# Patient Record
Sex: Female | Born: 1985 | Race: White | Hispanic: No | Marital: Married | Smoking: Former smoker
Health system: Southern US, Community
[De-identification: ages and names within clinical notes are randomized; demographics above are authoritative.]

## PROBLEM LIST (undated history)

## (undated) DIAGNOSIS — O24419 Gestational diabetes mellitus in pregnancy, unspecified control: Secondary | ICD-10-CM

## (undated) DIAGNOSIS — N83209 Unspecified ovarian cyst, unspecified side: Secondary | ICD-10-CM

## (undated) DIAGNOSIS — F419 Anxiety disorder, unspecified: Secondary | ICD-10-CM

## (undated) DIAGNOSIS — D649 Anemia, unspecified: Secondary | ICD-10-CM

## (undated) DIAGNOSIS — Z8719 Personal history of other diseases of the digestive system: Secondary | ICD-10-CM

## (undated) HISTORY — DX: Gestational diabetes mellitus in pregnancy, unspecified control: O24.419

---

## 2005-07-16 ENCOUNTER — Ambulatory Visit: Payer: Self-pay

## 2005-07-22 ENCOUNTER — Inpatient Hospital Stay: Payer: Self-pay | Admitting: Obstetrics and Gynecology

## 2015-08-24 ENCOUNTER — Other Ambulatory Visit: Payer: Self-pay | Admitting: Obstetrics

## 2015-10-31 ENCOUNTER — Other Ambulatory Visit (HOSPITAL_COMMUNITY): Payer: Self-pay | Admitting: General Surgery

## 2015-10-31 ENCOUNTER — Other Ambulatory Visit: Payer: Self-pay | Admitting: General Surgery

## 2015-11-01 ENCOUNTER — Ambulatory Visit
Admission: RE | Admit: 2015-11-01 | Discharge: 2015-11-01 | Disposition: A | Discharge status: Home / Self Care | Payer: Managed Care, Other (non HMO) | Source: Ambulatory Visit | Attending: General Surgery | Admitting: General Surgery

## 2015-11-01 DIAGNOSIS — I498 Other specified cardiac arrhythmias: Secondary | ICD-10-CM | POA: Insufficient documentation

## 2015-12-05 ENCOUNTER — Encounter: Payer: Self-pay | Admitting: Dietician

## 2015-12-05 ENCOUNTER — Encounter: Payer: Managed Care, Other (non HMO) | Attending: General Surgery | Admitting: Dietician

## 2015-12-05 DIAGNOSIS — Z713 Dietary counseling and surveillance: Secondary | ICD-10-CM | POA: Diagnosis not present

## 2015-12-05 NOTE — Progress Notes (Signed)
Medical Nutrition Therapy: Visit start time: 0900  end time: 1000  Assessment:  Diagnosis: obesity Past medical history: none significant Psychosocial issues/ stress concerns: none Preferred learning method:  . Visual  Current weight: 288.5lbs  Height: 5'6" Medications, supplements: Sprintec  Progress and evaluation: Patient reports weight gain of 88lbs in the past year since her wedding.  Was at lowest weight about 3 years ago. Had job change to more sedentary job with fewer hours and has since regained weight.  Highest weight was after c-section 10 years ago, about 315lbs. Has tried multiple diets in the past 10 years, including use of phentermine, with limited and only short term success.   Physical activity: no structured activity; household chores, usually standing at work. Plans to increase activity. Likes to exercise, used to play sports in high school.  Dietary Intake:  Usual eating pattern includes 3 meals and 1 snacks per day. Dining out frequency: 2-3 meals per week.  Breakfast: cereal, bagel, nutrigrain bars, or low-cal breakfast sandwich Snack: occasional granola bar Lunch: sandwich, sometimes salad, fruit or yogurt Snack: usually none Supper: varies; 11/5 spaghetti, usually a meat and veggies including some starchy vegetables.  Snack: grapes or mini candy or ice cream Beverages: water often with sugar free flavoring, maybe 1 diet soda daily  Nutrition Care Education: Topics covered: bariatric diet and pre-op nutrition Basic nutrition:  appropriate nutrient balance, appropriate meal and snack schedule Bariatric nutrition: importance of making changes prior to surgery to avoid difficult transition and side effects post-op; options for protein drinks and use for liver shrinking diet and post-op nutrition; diet stages after surgery, including goals for fluid and protein intake; importance of emotional preparedness for permanent and constant diet changes; importance of emotional  support in surgery and weight loss process.    Nutritional Diagnosis:  [place]-3.3 Overweight/obesity As related to history of excess calories and frequent dieting.  As evidenced by patient report.  Intervention: Instruction as noted above.   Set goals with patient input.    Patient has been researching weight loss surgery for some time; she is familiar with and prepared for diet and lifestyle changes, has family support. From a nutrition standpoint she is ready to proceed with the bariatric surgery program.    Patient has been instructed to schedule pre-op nutrition class 3-4 weeks prior to surgery, and to schedule RD follow-up for 2-3 weeks after surgery.   Education Materials given:  Marland Kitchen. Bariatric surgery guidelines and diet: pre-op goals, diet stages after surgery . Food lists/ Planning A Balanced Meal . Goals/ instructions  Learner/ who was taught:  . Patient   Level of understanding: Marland Kitchen. Verbalizes/ demonstrates competency . Not applicable Demonstrated degree of understanding via:   Teach back Learning barriers: . None  Willingness to learn/ readiness for change: . Eager, change in progress  Monitoring and Evaluation:  Dietary intake, exercise, and body weight      follow up: to be determined

## 2015-12-05 NOTE — Patient Instructions (Signed)
   Continue to eat your meals and 1-2 small snacks daily.   Start taste testing some protein shakes or powders to see which work for you.   Limit portions of starchy foods, eat lean protein foods and plenty of low-carb veggies.   Limit fluid intake during meals.   Begin some light exercise for 15 minutes at a time as you are able.   Remember to schedule pre-op nutrition class, and post op visit with Pam when you get your surgery date.

## 2016-02-12 ENCOUNTER — Encounter: Payer: Self-pay | Admitting: Dietician

## 2016-02-12 ENCOUNTER — Encounter: Payer: Managed Care, Other (non HMO) | Attending: General Surgery | Admitting: Dietician

## 2016-02-12 DIAGNOSIS — Z713 Dietary counseling and surveillance: Secondary | ICD-10-CM | POA: Diagnosis present

## 2016-02-12 NOTE — Progress Notes (Signed)
  Pre-Operative Nutrition Class:  Appt start time: 830   End time:  1000  Patient was seen on 02/12/16 for Pre-Operative Bariatric Surgery Education at [facility].   Surgery date:  Surgery type: LAGB Start weight at [facility]: 288.5 lbs on 12/05/2015 Weight today: 295.4 lbs  TANITA  BODY COMP RESULTS  02/12/16   BMI (kg/m^2) 47.7   Fat Mass (lbs) 163.4   Fat Free Mass (lbs) 132   Total Body Water (lbs) 99.4   Samples given per MNT protocol. Patient educated on appropriate usage: Bariatric Advantage Multivitamin (mixed fruit - qty 1) Lot #: [id number] Exp: 11/2016  Bariatric Advantage Calcium Citrate chew (lemon - qty 1) Lot #: [id number] Exp: 06/2016  Premier protein shake (vanilla - qty 1) Lot #: [id number] Exp: 10/2016  The following the learning objectives were met by the patient during this course:  Identify Pre-Op Dietary Goals and will begin 2 weeks pre-operatively  Identify appropriate sources of fluids and proteins   State protein recommendations and appropriate sources pre and post-operatively  Identify Post-Operative Dietary Goals and will follow for 2 weeks post-operatively  Identify appropriate multivitamin and calcium sources  Describe the need for physical activity post-operatively and will follow MD recommendations  State when to call healthcare provider regarding medication questions or post-operative complications  Handouts given during class include:  Pre-Op Bariatric Surgery Diet Handout  Protein Shake Handout  Post-Op Bariatric Surgery Nutrition Handout  BELT Program Information Flyer  Support Group Information Flyer  [facility] Bariatric Supplements Price List  Follow-Up Plan: Patient will follow-up at [facility] 2 weeks post operatively for diet advancement per MD.

## 2016-03-22 ENCOUNTER — Encounter: Payer: Managed Care, Other (non HMO) | Attending: General Surgery | Admitting: Dietician

## 2016-03-22 ENCOUNTER — Encounter: Payer: Self-pay | Admitting: Dietician

## 2016-03-22 DIAGNOSIS — Z713 Dietary counseling and surveillance: Secondary | ICD-10-CM | POA: Insufficient documentation

## 2016-03-22 NOTE — Patient Instructions (Signed)
   Continue with your current eating pattern.  Keep up your regular walking and exercise, great job!

## 2016-03-22 NOTE — Progress Notes (Signed)
Medical Nutrition Therapy: Visit start time: 1345  end time: 1415  Assessment:  Diagnosis: obesity Medical history changes: no changes since pervious visit, 12/05/15 Psychosocial issues/ stress concerns: none  Current weight: 303.4lbs  Height: 5'6" Medications, supplement changes: reconciled list in medical record  Progress and evaluation: Patient reports a period of frustration after finding out her surgery approval would be delayed, and neglected diet for a short while. She now reports restricting portions of sweets, starches, including lean protein foods, and she is avoiding fried foods, candy, other high-calorie choices. She has increased physical activity. She also has tasted some protein shakes, and sometimes has a shake for her breakfast meal. She is taking a multivitamin daily.  Physical activity: walking 20 minutes, 4 times a week or more, playing with puppy outside at home 10-15 minutes daily. Also increased walking during the day at work.   Dietary Intake:  Usual eating pattern includes 3 meals and 1-2 snacks per day. Dining out frequency: 4 meals per week.  Breakfast: protein shake, or low sugar ceral Snack: trail mix or crackers Lunch: salad or sandwich, fruit or yogurt or cheese stick Snack: usually none Supper: lean meat, veggies Snack: fruit or crackers or trail mix. Beverages: water, powerade zero  Nutrition Care Education: Topics covered: weight management, bariatric diet pre-op goals Weight control: reviewed progress since previous visit. Discussed current eating pattern and physical activity.  Bariatric diet:  Reviewed importance of eating at regular intervals, controlling carbohydrate intake and preparing for very low carb diet just prior to and after surgery. Reviewed importance of positive emotional support from others during the weight loss journey.   Nutritional Diagnosis:  [place]-3.3 Overweight/obesity As related to history of dieting, excess calories, inactivity.   As evidenced by patient report.  Intervention: Discussion as noted above.   Commended patient for resuming healthy eating pattern and starting exercise.    She will continue with these habits until next visit on 04/15/16.    Learner/ who was taught:  . Patient   Level of understanding: Marland Kitchen. Verbalizes/ demonstrates competency  Demonstrated degree of understanding via:   Teach back Learning barriers: . None  Willingness to learn/ readiness for change: . Eager, change in progress  Monitoring and Evaluation:  Dietary intake, exercise, and body weight      follow up: 04/15/16

## 2016-04-15 ENCOUNTER — Encounter: Payer: Self-pay | Admitting: Dietician

## 2016-04-15 ENCOUNTER — Encounter: Payer: Managed Care, Other (non HMO) | Attending: General Surgery | Admitting: Dietician

## 2016-04-15 DIAGNOSIS — Z713 Dietary counseling and surveillance: Secondary | ICD-10-CM | POA: Insufficient documentation

## 2016-04-15 NOTE — Progress Notes (Signed)
Medical Nutrition Therapy: Visit start time: 1320  end time: 1335  Assessment:  Diagnosis: obesity Medical history changes: no changes per patient Psychosocial issues/ stress concerns: none  Current weight: 305lbs  Height: 5'6" Medications, supplement changes: no changes per patient  Progress and evaluation: Weight is within 2lbs of visit on 03/22/26. Eating pattern remains consistent. Patient continues to sample protein drinks and use them for meals on occasion.  She reports increased stress at work recently, so has not been able to eat snacks on a regular basis. She continues to drink sugar free beverages, avoids sodas, and is generally making healthy food choices.    Physical activity: walking 20 minutes, 4 times a week + playing with puppy  Dietary Intake:  Usual eating pattern includes 3 meals and 0-1 snacks per day. Dining out frequency: 4 meals per week.  Breakfast: low sugar cereal, occasional protein shake Snack: none or trail mix or crackers Lunch: salad or sandwich with fruit or yogurt or cheese stick Snack: none  Supper: lean meat, vegetables Snack: fruit or trail mix Beverages: water, powerade zero  Nutrition Care Education: Topics covered: weight management Weight control: reviewed progress since previous visit. Discussed importance of allowing time before surgery to prepare for lifestyle changes.    Nutritional Diagnosis:  [place]-3.3 Overweight/obesity As related to history of excess calories, inactivity.  As evidenced by patient report, BMI 49.  Intervention: Discussion as noted above.   Patient voices determination to adhere to bariatric diet after surgery, she is hoping to become pregnant when safe after surgery.    She will continue with regular physical activity and healthy eating pattern, no new goals at this time.        Education Materials given:  Marland Kitchen. Goals/ instructions  Learner/ who was taught:  . Patient   Level of understanding: Marland Kitchen. Verbalizes/  demonstrates competency  Demonstrated degree of understanding via:   Teach back Learning barriers: . None  Willingness to learn/ readiness for change: . Eager, change in progress  Monitoring and Evaluation:  Dietary intake, exercise, and body weight      follow up: 05/13/16

## 2016-04-15 NOTE — Patient Instructions (Signed)
Continue with current eating pattern and regular physical exercise.

## 2016-05-13 ENCOUNTER — Encounter: Payer: Self-pay | Admitting: Dietician

## 2016-05-13 ENCOUNTER — Encounter: Payer: Managed Care, Other (non HMO) | Attending: General Surgery | Admitting: Dietician

## 2016-05-13 DIAGNOSIS — Z713 Dietary counseling and surveillance: Secondary | ICD-10-CM | POA: Insufficient documentation

## 2016-05-13 NOTE — Patient Instructions (Addendum)
   Implement pre-op liver reduction diet fully this week.   Continue to reduce fluid intake during meals, take only small sips if needed to moisten food.   Call with any questions or concerns.

## 2016-05-13 NOTE — Progress Notes (Signed)
Medical Nutrition Therapy: Visit start time: 1330  end time: 1400  Assessment:  Diagnosis: obesity, bariatric surgery prep Medical history changes: no changes per patient Psychosocial issues/ stress concerns: none  Current weight: 305.4lbs  Height: 5'6" Medications, supplement changes: no changes   Progress and evaluation: Patient continues to make changes in preparation for weight loss surgery. She has purchased appropriate foods and protein drinks to begin liver reduction diet phase tomorrow. She has sought out opportunities for physical activity.  She receives good support from family members and coworkers.   Physical activity: walking, playing with puppy, squats, other general increase in actvity  Dietary Intake:  Usual eating pattern includes 3 meals and 2-3 snacks per day. Dining out frequency: 4 meals per week.  Breakfast: beginning protein shakes daily Snack: none or fruit Lunch: frozen low-cal meal Snack: none or fruit Supper: lean protein and salad (mixed greens) [employer] dressing, or low-cal frozen meal Snack: yogurt 1/2-1 cup Beverages: water, powerade zero Has protein powder to add to beverages  Nutrition Care Education: Topics covered: weight loss preparation for bariatric surgery, bariatric diet stages  Weight loss surgery prep: liver reduction diet, reviewed options for protein drinks and for balanced, low-carb meals. Bariatric diet: reviewed diet stages 2 (full liquids) and 3 (soft, blended foods) and discussed food choices, portions and eating schedules. Discussed importance of tracking protein and fluid intake. Reviewed guidelines for avoiding fluids within 30 minutes before and after eating, eating slowly and chewing thoroughly, and recognizing fullness cues. Reviewed importance of controlling emotional connections with food.   Nutritional Diagnosis:  [place]-3.3 Overweight/obesity As related to history of excess calories, inactvity.  As evidenced by patient report,  BMI 49.  Intervention: Discussion as noted above.   Patient has reviewed bariatric diet thoroughly and has progressed with diet and lifestyle changes.   She has a good support system.    She recognizes the challenges with weight loss surgery and lifelong change in diet and lifestyle.    From nutrition standpoint, she is ready to progress with the surgery program.   Education Materials given:  Marland Kitchen Gastric bypass and gastric sleeve post-op diet [facility]) . Stage 2,3 bariatric diet (AND) . Goals/ instructions  Learner/ who was taught:  . Patient   Level of understanding: Marland Kitchen Verbalizes/ demonstrates competency  Demonstrated degree of understanding via:   Teach back Learning barriers: . None  Willingness to learn/ readiness for change: . Eager, change in progress  Monitoring and Evaluation:  Dietary intake, exercise, and body weight      follow up: 06/12/16 for post-op visit

## 2016-06-12 ENCOUNTER — Ambulatory Visit: Payer: Managed Care, Other (non HMO) | Admitting: Dietician

## 2016-07-11 ENCOUNTER — Telehealth: Payer: Self-pay | Admitting: Dietician

## 2016-07-11 NOTE — Telephone Encounter (Signed)
Received message that patient has bariatric surgery scheduled for 08/05/16, so will need post-op appointment scheduled after that date. Called Ms. Greggory StallionGeorge and left a voicemail message offering 08/26/16 and requested a call back.

## 2016-07-25 NOTE — Progress Notes (Signed)
Preop on 7/3.  Needs orders in epic.

## 2016-07-26 ENCOUNTER — Ambulatory Visit: Payer: Self-pay | Admitting: General Surgery

## 2016-07-29 NOTE — Progress Notes (Signed)
11-01-15 (EPIC) EKG, CXR

## 2016-07-29 NOTE — Patient Instructions (Addendum)
Catherine ShanksHolly Lewis  07/29/2016   Your procedure is scheduled on: 08-06-16  [facility]  Entrance [facility] to 3rd floor to Short Stay Center at 10:15 AM.   Call this number if you have problems the morning of surgery [phone]    Remember: ONLY 1 PERSON MAY GO WITH YOU TO SHORT STAY TO GET  READY MORNING OF YOUR SURGERY.  Do not eat food or drink liquids :After Midnight.     Take these medicines the morning of surgery with A SIP OF WATER: None                                You may not have any metal on your body including hair pins and              piercings  Do not wear jewelry, make-up, lotions, powders or perfumes, deodorant             Do not wear nail polish.  Do not shave  48 hours prior to surgery.               Do not bring valuables to the hospital. [facility] IS NOT             RESPONSIBLE   FOR VALUABLES.  Contacts, dentures or bridgework may not be worn into surgery.  Leave suitcase in the car. After surgery it may be brought to your room.     Please read over the following fact sheets you were given: _____________________________________________________________________  [facility] - Preparing for Surgery Before surgery, you can play an important role.  Because skin is not sterile, your skin needs to be as free of germs as possible.  You can reduce the number of germs on your skin by washing with CHG (chlorahexidine gluconate) soap before surgery.  CHG is an antiseptic cleaner which kills germs and bonds with the skin to continue killing germs even after washing. Please DO NOT use if you have an allergy to CHG or antibacterial soaps.  If your skin becomes reddened/irritated stop using the CHG and inform your nurse when you arrive at Short Stay. Do not shave (including legs and underarms) for at least 48 hours prior to the first CHG shower.  You may shave your face/neck. Please follow these instructions carefully:  1.   Shower with CHG Soap the night before surgery and the  morning of Surgery.  2.  If you choose to wash your hair, wash your hair first as usual with your  normal  shampoo.  3.  After you shampoo, rinse your hair and body thoroughly to remove the  shampoo.                           4.  Use CHG as you would any other liquid soap.  You can apply chg directly  to the skin and wash                       Gently with a scrungie or clean washcloth.  5.  Apply the CHG Soap to your body ONLY FROM THE NECK DOWN.   Do not use on face/ open  Wound or open sores. Avoid contact with eyes, ears mouth and genitals (private parts).                       Wash face,  Genitals (private parts) with your normal soap.             6.  Wash thoroughly, paying special attention to the area where your surgery  will be performed.  7.  Thoroughly rinse your body with warm water from the neck down.  8.  DO NOT shower/wash with your normal soap after using and rinsing off  the CHG Soap.                9.  Pat yourself dry with a clean towel.            10.  Wear clean pajamas.            11.  Place clean sheets on your bed the night of your first shower and do not  sleep with pets. Day of Surgery : Do not apply any lotions/deodorants the morning of surgery.  Please wear clean clothes to the hospital/surgery center.  FAILURE TO FOLLOW THESE INSTRUCTIONS MAY RESULT IN THE CANCELLATION OF YOUR SURGERY PATIENT SIGNATURE_________________________________  NURSE SIGNATURE__________________________________  ________________________________________________________________________

## 2016-07-30 ENCOUNTER — Encounter (HOSPITAL_COMMUNITY)
Admission: RE | Admit: 2016-07-30 | Discharge: 2016-07-30 | Disposition: A | Discharge status: Home / Self Care | Payer: Managed Care, Other (non HMO) | Source: Ambulatory Visit | Attending: General Surgery | Admitting: General Surgery

## 2016-07-30 ENCOUNTER — Encounter (HOSPITAL_COMMUNITY): Payer: Self-pay | Admitting: *Deleted

## 2016-07-30 DIAGNOSIS — Z01818 Encounter for other preprocedural examination: Secondary | ICD-10-CM | POA: Insufficient documentation

## 2016-07-30 DIAGNOSIS — K449 Diaphragmatic hernia without obstruction or gangrene: Secondary | ICD-10-CM | POA: Insufficient documentation

## 2016-07-30 HISTORY — DX: Personal history of other diseases of the digestive system: Z87.19

## 2016-07-30 LAB — CBC WITH DIFFERENTIAL/PLATELET
BASOS ABS: 0 10*3/uL (ref 0.0–0.1)
BASOS PCT: 0 %
EOS ABS: 0.1 10*3/uL (ref 0.0–0.7)
EOS PCT: 1 %
HCT: 37.6 % (ref 36.0–46.0)
Hemoglobin: 13.3 g/dL (ref 12.0–15.0)
Lymphocytes Relative: 34 %
Lymphs Abs: 3.4 10*3/uL (ref 0.7–4.0)
MCH: 30.5 pg (ref 26.0–34.0)
MCHC: 35.4 g/dL (ref 30.0–36.0)
MCV: 86.2 fL (ref 78.0–100.0)
Monocytes Absolute: 0.4 10*3/uL (ref 0.1–1.0)
Monocytes Relative: 4 %
Neutro Abs: 6.1 10*3/uL (ref 1.7–7.7)
Neutrophils Relative %: 61 %
PLATELETS: 233 10*3/uL (ref 150–400)
RBC: 4.36 MIL/uL (ref 3.87–5.11)
RDW: 12.6 % (ref 11.5–15.5)
WBC: 10 10*3/uL (ref 4.0–10.5)

## 2016-07-30 LAB — COMPREHENSIVE METABOLIC PANEL
ALBUMIN: 4.2 g/dL (ref 3.5–5.0)
ALT: 28 U/L (ref 14–54)
AST: 38 U/L (ref 15–41)
Alkaline Phosphatase: 72 U/L (ref 38–126)
Anion gap: 7 (ref 5–15)
BUN: 16 mg/dL (ref 6–20)
CHLORIDE: 105 mmol/L (ref 101–111)
CO2: 27 mmol/L (ref 22–32)
CREATININE: 0.78 mg/dL (ref 0.44–1.00)
Calcium: 9.6 mg/dL (ref 8.9–10.3)
GFR calc Af Amer: >60 mL/min (ref >60)
GFR calc non Af Amer: >60 mL/min (ref >60)
Glucose, Bld: 91 mg/dL (ref 65–99)
Potassium: 3.9 mmol/L (ref 3.5–5.1)
SODIUM: 139 mmol/L (ref 135–145)
Total Bilirubin: 1.2 mg/dL (ref 0.3–1.2)
Total Protein: 7.7 g/dL (ref 6.5–8.1)

## 2016-08-06 ENCOUNTER — Ambulatory Visit (HOSPITAL_COMMUNITY): Payer: Managed Care, Other (non HMO) | Admitting: Anesthesiology

## 2016-08-06 ENCOUNTER — Encounter (HOSPITAL_COMMUNITY): Payer: Self-pay | Admitting: *Deleted

## 2016-08-06 ENCOUNTER — Ambulatory Visit (HOSPITAL_COMMUNITY)
Admission: RE | Admit: 2016-08-06 | Discharge: 2016-08-06 | Disposition: A | Discharge status: Home / Self Care | Payer: Managed Care, Other (non HMO) | Source: Ambulatory Visit | Attending: General Surgery | Admitting: General Surgery

## 2016-08-06 ENCOUNTER — Ambulatory Visit (HOSPITAL_COMMUNITY): Payer: Managed Care, Other (non HMO)

## 2016-08-06 ENCOUNTER — Encounter (HOSPITAL_COMMUNITY)
Admission: RE | Disposition: A | Discharge status: Home / Self Care | Payer: Self-pay | Source: Ambulatory Visit | Attending: General Surgery

## 2016-08-06 DIAGNOSIS — K449 Diaphragmatic hernia without obstruction or gangrene: Secondary | ICD-10-CM | POA: Diagnosis not present

## 2016-08-06 DIAGNOSIS — Z793 Long term (current) use of hormonal contraceptives: Secondary | ICD-10-CM | POA: Insufficient documentation

## 2016-08-06 DIAGNOSIS — Z6841 Body Mass Index (BMI) 40.0 and over, adult: Secondary | ICD-10-CM | POA: Diagnosis not present

## 2016-08-06 DIAGNOSIS — Z79899 Other long term (current) drug therapy: Secondary | ICD-10-CM | POA: Insufficient documentation

## 2016-08-06 DIAGNOSIS — K219 Gastro-esophageal reflux disease without esophagitis: Secondary | ICD-10-CM | POA: Diagnosis not present

## 2016-08-06 DIAGNOSIS — Z87891 Personal history of nicotine dependence: Secondary | ICD-10-CM | POA: Diagnosis not present

## 2016-08-06 DIAGNOSIS — G8929 Other chronic pain: Secondary | ICD-10-CM | POA: Diagnosis not present

## 2016-08-06 DIAGNOSIS — R109 Unspecified abdominal pain: Secondary | ICD-10-CM

## 2016-08-06 DIAGNOSIS — Z9884 Bariatric surgery status: Secondary | ICD-10-CM

## 2016-08-06 DIAGNOSIS — M79671 Pain in right foot: Secondary | ICD-10-CM | POA: Insufficient documentation

## 2016-08-06 HISTORY — PX: LAPAROSCOPIC GASTRIC BANDING WITH HIATAL HERNIA REPAIR: SHX6351

## 2016-08-06 LAB — PREGNANCY, URINE: Preg Test, Ur: NEGATIVE

## 2016-08-06 SURGERY — GASTRIC BANDING, LAPAROSCOPIC, WITH HIATAL HERNIA REPAIR
Anesthesia: General | Site: Abdomen

## 2016-08-06 MED ORDER — FENTANYL CITRATE (PF) 100 MCG/2ML IJ SOLN: 25.0000 ug | INTRAMUSCULAR | Status: DC | PRN

## 2016-08-06 MED ORDER — ONDANSETRON HCL 4 MG/2ML IJ SOLN
4.0000 mg | INTRAMUSCULAR | Status: DC | PRN
  Administered 2016-08-06: 4 mg via INTRAVENOUS
  Filled 2016-08-06: qty 2

## 2016-08-06 MED ORDER — BUPIVACAINE LIPOSOME 1.3 % IJ SUSP
20.0000 mL | Freq: Once | INTRAMUSCULAR | Status: AC
  Administered 2016-08-06: 20 mL
  Filled 2016-08-06: qty 20

## 2016-08-06 MED ORDER — CHLORHEXIDINE GLUCONATE 4 % EX LIQD: 60.0000 mL | Freq: Once | CUTANEOUS | Status: DC

## 2016-08-06 MED ORDER — MIDAZOLAM HCL 5 MG/5ML IJ SOLN
INTRAMUSCULAR | Status: DC | PRN
  Administered 2016-08-06: 2 mg via INTRAVENOUS

## 2016-08-06 MED ORDER — ACETAMINOPHEN 500 MG PO TABS
1000.0000 mg | ORAL_TABLET | ORAL | Status: AC
  Administered 2016-08-06: 1000 mg via ORAL
  Filled 2016-08-06: qty 2

## 2016-08-06 MED ORDER — APREPITANT 40 MG PO CAPS
40.0000 mg | ORAL_CAPSULE | ORAL | Status: AC
  Administered 2016-08-06: 40 mg via ORAL
  Filled 2016-08-06: qty 1

## 2016-08-06 MED ORDER — ONDANSETRON HCL 4 MG/2ML IJ SOLN
INTRAMUSCULAR | Status: DC | PRN
  Administered 2016-08-06: 4 mg via INTRAVENOUS

## 2016-08-06 MED ORDER — HEPARIN SODIUM (PORCINE) 5000 UNIT/ML IJ SOLN
5000.0000 [IU] | INTRAMUSCULAR | Status: AC
  Administered 2016-08-06: 5000 [IU] via SUBCUTANEOUS
  Filled 2016-08-06: qty 1

## 2016-08-06 MED ORDER — DEXAMETHASONE SODIUM PHOSPHATE 4 MG/ML IJ SOLN
4.0000 mg | INTRAMUSCULAR | Status: AC
  Administered 2016-08-06: 4 mg via INTRAVENOUS
  Filled 2016-08-06: qty 1

## 2016-08-06 MED ORDER — LACTATED RINGERS IR SOLN
Status: DC | PRN
  Administered 2016-08-06: 1000 mL

## 2016-08-06 MED ORDER — PROMETHAZINE HCL 25 MG/ML IJ SOLN: 6.2500 mg | INTRAMUSCULAR | Status: DC | PRN

## 2016-08-06 MED ORDER — SODIUM CHLORIDE 0.9 % IJ SOLN
INTRAMUSCULAR | Status: AC
  Filled 2016-08-06: qty 20

## 2016-08-06 MED ORDER — PROPOFOL 10 MG/ML IV BOLUS
INTRAVENOUS | Status: AC
  Filled 2016-08-06: qty 20

## 2016-08-06 MED ORDER — OXYCODONE HCL 5 MG/5ML PO SOLN: 5.0000 mg | ORAL | Status: DC | PRN

## 2016-08-06 MED ORDER — OXYCODONE HCL 5 MG/5ML PO SOLN: 5.0000 mg | ORAL | 0 refills | Status: DC | PRN

## 2016-08-06 MED ORDER — SUGAMMADEX SODIUM 200 MG/2ML IV SOLN
INTRAVENOUS | Status: AC
  Filled 2016-08-06: qty 4

## 2016-08-06 MED ORDER — LACTATED RINGERS IV SOLN
INTRAVENOUS | Status: DC
  Administered 2016-08-06: 13:00:00 via INTRAVENOUS

## 2016-08-06 MED ORDER — DEXAMETHASONE SODIUM PHOSPHATE 10 MG/ML IJ SOLN
INTRAMUSCULAR | Status: AC
  Filled 2016-08-06: qty 1

## 2016-08-06 MED ORDER — SCOPOLAMINE 1 MG/3DAYS TD PT72
1.0000 | MEDICATED_PATCH | TRANSDERMAL | Status: DC
  Administered 2016-08-06: 1.5 mg via TRANSDERMAL
  Filled 2016-08-06: qty 1

## 2016-08-06 MED ORDER — LIDOCAINE HCL (CARDIAC) 20 MG/ML IV SOLN
INTRAVENOUS | Status: DC | PRN
  Administered 2016-08-06: 100 mg via INTRAVENOUS

## 2016-08-06 MED ORDER — ONDANSETRON HCL 4 MG/2ML IJ SOLN
INTRAMUSCULAR | Status: AC
  Filled 2016-08-06: qty 2

## 2016-08-06 MED ORDER — CELECOXIB 200 MG PO CAPS
400.0000 mg | ORAL_CAPSULE | ORAL | Status: AC
  Administered 2016-08-06: 400 mg via ORAL
  Filled 2016-08-06: qty 2

## 2016-08-06 MED ORDER — ROCURONIUM BROMIDE 50 MG/5ML IV SOSY
PREFILLED_SYRINGE | INTRAVENOUS | Status: AC
  Filled 2016-08-06: qty 5

## 2016-08-06 MED ORDER — SODIUM CHLORIDE 0.9 % IJ SOLN
INTRAMUSCULAR | Status: AC
  Filled 2016-08-06: qty 50

## 2016-08-06 MED ORDER — CEFOTETAN DISODIUM-DEXTROSE 2-2.08 GM-% IV SOLR
2.0000 g | INTRAVENOUS | Status: AC
  Administered 2016-08-06: 2 g via INTRAVENOUS
  Filled 2016-08-06: qty 50

## 2016-08-06 MED ORDER — MEPERIDINE HCL 50 MG/ML IJ SOLN: 6.2500 mg | INTRAMUSCULAR | Status: DC | PRN

## 2016-08-06 MED ORDER — LIDOCAINE 2% (20 MG/ML) 5 ML SYRINGE
INTRAMUSCULAR | Status: AC
  Filled 2016-08-06: qty 5

## 2016-08-06 MED ORDER — LACTATED RINGERS IV SOLN: INTRAVENOUS | Status: DC

## 2016-08-06 MED ORDER — GABAPENTIN 300 MG PO CAPS
300.0000 mg | ORAL_CAPSULE | ORAL | Status: AC
  Administered 2016-08-06: 300 mg via ORAL
  Filled 2016-08-06: qty 1

## 2016-08-06 MED ORDER — SUGAMMADEX SODIUM 200 MG/2ML IV SOLN
INTRAVENOUS | Status: DC | PRN
  Administered 2016-08-06: 300 mg via INTRAVENOUS

## 2016-08-06 MED ORDER — ACETAMINOPHEN 160 MG/5ML PO SOLN
650.0000 mg | ORAL | Status: DC | PRN
  Administered 2016-08-06: 650 mg via ORAL

## 2016-08-06 MED ORDER — PROPOFOL 10 MG/ML IV BOLUS
INTRAVENOUS | Status: DC | PRN
  Administered 2016-08-06: 200 mg via INTRAVENOUS

## 2016-08-06 MED ORDER — FENTANYL CITRATE (PF) 250 MCG/5ML IJ SOLN
INTRAMUSCULAR | Status: AC
  Filled 2016-08-06: qty 5

## 2016-08-06 MED ORDER — ACETAMINOPHEN 160 MG/5ML PO SOLN
325.0000 mg | ORAL | Status: DC | PRN
  Filled 2016-08-06: qty 20.3

## 2016-08-06 MED ORDER — ROCURONIUM BROMIDE 100 MG/10ML IV SOLN
INTRAVENOUS | Status: DC | PRN
  Administered 2016-08-06: 50 mg via INTRAVENOUS
  Administered 2016-08-06: 10 mg via INTRAVENOUS
  Administered 2016-08-06: 20 mg via INTRAVENOUS

## 2016-08-06 MED ORDER — SODIUM CHLORIDE 0.9 % IV SOLN: INTRAVENOUS | Status: DC

## 2016-08-06 MED ORDER — MIDAZOLAM HCL 2 MG/2ML IJ SOLN
INTRAMUSCULAR | Status: AC
  Filled 2016-08-06: qty 2

## 2016-08-06 MED ORDER — FENTANYL CITRATE (PF) 100 MCG/2ML IJ SOLN
INTRAMUSCULAR | Status: DC | PRN
  Administered 2016-08-06: 100 ug via INTRAVENOUS
  Administered 2016-08-06 (×2): 50 ug via INTRAVENOUS

## 2016-08-06 SURGICAL SUPPLY — 56 items
BAND LAP 10.0 W/TUBES (Band) ×2 IMPLANT
BAND LAP 10.0CM W/TUBES (Band) ×1 IMPLANT
BENZOIN TINCTURE PRP APPL 2/3 (GAUZE/BANDAGES/DRESSINGS) ×3 IMPLANT
BLADE HEX COATED 2.75 (ELECTRODE) IMPLANT
BLADE SURG 15 STRL LF DISP TIS (BLADE) ×1 IMPLANT
BLADE SURG 15 STRL SS (BLADE) ×2
BLADE SURG SZ11 CARB STEEL (BLADE) ×3 IMPLANT
CHLORAPREP W/TINT 26ML (MISCELLANEOUS) ×6 IMPLANT
CLOSURE WOUND 1/2 X4 (GAUZE/BANDAGES/DRESSINGS)
DECANTER SPIKE VIAL GLASS SM (MISCELLANEOUS) ×3 IMPLANT
DERMABOND ADVANCED (GAUZE/BANDAGES/DRESSINGS)
DERMABOND ADVANCED .7 DNX12 (GAUZE/BANDAGES/DRESSINGS) IMPLANT
DEVICE SUT QUICK LOAD TK 5 (STAPLE) ×8 IMPLANT
DEVICE SUT TI-KNOT TK 5X26 (MISCELLANEOUS) ×2 IMPLANT
DEVICE SUTURE ENDOST 10MM (ENDOMECHANICALS) ×3 IMPLANT
DEVICE TI KNOT TK5 (MISCELLANEOUS) ×1
DISSECTOR BLUNT TIP ENDO 5MM (MISCELLANEOUS) IMPLANT
DRAPE UTILITY XL STRL (DRAPES) ×9 IMPLANT
ELECT PENCIL ROCKER SW 15FT (MISCELLANEOUS) ×3 IMPLANT
ELECT REM PT RETURN 15FT ADLT (MISCELLANEOUS) ×3 IMPLANT
GLOVE BIO SURGEON STRL SZ7.5 (GLOVE) ×3 IMPLANT
GLOVE BIOGEL M STRL SZ7.5 (GLOVE) IMPLANT
GLOVE INDICATOR 8.0 STRL GRN (GLOVE) ×3 IMPLANT
GOWN STRL REUS W/TWL LRG LVL3 (GOWN DISPOSABLE) ×3 IMPLANT
GOWN STRL REUS W/TWL XL LVL3 (GOWN DISPOSABLE) ×6 IMPLANT
HOVERMATT SINGLE USE (MISCELLANEOUS) ×3 IMPLANT
IRRIG SUCT STRYKERFLOW 2 WTIP (MISCELLANEOUS)
IRRIGATION SUCT STRKRFLW 2 WTP (MISCELLANEOUS) IMPLANT
KIT BASIN OR (CUSTOM PROCEDURE TRAY) ×3 IMPLANT
MESH HERNIA 1X4 RECT BARD (Mesh General) ×1 IMPLANT
MESH HERNIA BARD 1X4 (Mesh General) ×2 IMPLANT
NEEDLE SPNL 22GX3.5 QUINCKE BK (NEEDLE) ×3 IMPLANT
NS IRRIG 1000ML POUR BTL (IV SOLUTION) ×3 IMPLANT
PACK UNIVERSAL I (CUSTOM PROCEDURE TRAY) ×3 IMPLANT
QUICK LOAD TK 5 (STAPLE) ×4
SHEARS HARMONIC ACE PLUS 36CM (ENDOMECHANICALS) ×3 IMPLANT
SOLUTION ANTI FOG 6CC (MISCELLANEOUS) ×3 IMPLANT
SPONGE LAP 18X18 X RAY DECT (DISPOSABLE) ×3 IMPLANT
STAPLER VISISTAT 35W (STAPLE) IMPLANT
STRIP CLOSURE SKIN 1/2X4 (GAUZE/BANDAGES/DRESSINGS) IMPLANT
SUT ETHIBOND 2 0 SH (SUTURE) ×6
SUT ETHIBOND 2 0 SH 36X2 (SUTURE) ×3 IMPLANT
SUT MNCRL AB 4-0 PS2 18 (SUTURE) ×3 IMPLANT
SUT PROLENE 2 0 CT2 30 (SUTURE) ×3 IMPLANT
SUT SILK 0 (SUTURE) ×2
SUT SILK 0 30XBRD TIE 6 (SUTURE) ×1 IMPLANT
SUT SURGIDAC NAB ES-9 0 48 120 (SUTURE) ×3 IMPLANT
SUT VIC AB 2-0 SH 27 (SUTURE) ×2
SUT VIC AB 2-0 SH 27X BRD (SUTURE) ×1 IMPLANT
SYR 20CC LL (SYRINGE) ×3 IMPLANT
SYR CONTROL 10ML LL (SYRINGE) ×3 IMPLANT
TOWEL OR 17X26 10 PK STRL BLUE (TOWEL DISPOSABLE) ×3 IMPLANT
TROCAR BLADELESS OPT 5 100 (ENDOMECHANICALS) ×9 IMPLANT
TUBE CALIBRATION LAPBAND (TUBING) ×3 IMPLANT
TUBING INSUF HEATED (TUBING) ×3 IMPLANT
TUBING INSUFFLATION 10FT LAP (TUBING) ×3 IMPLANT

## 2016-08-06 NOTE — Anesthesia Procedure Notes (Signed)
Procedure Name: Intubation Date/Time: 08/06/2016 12:52 PM Performed by: Thornell MuleSTUBBLEFIELD,  G Pre-anesthesia Checklist: Patient identified, Emergency Drugs available, Suction available and Patient being monitored Patient Re-evaluated:Patient Re-evaluated prior to inductionOxygen Delivery Method: Circle system utilized Preoxygenation: Pre-oxygenation with 100% oxygen Intubation Type: IV induction Ventilation: Mask ventilation without difficulty Laryngoscope Size: Miller and 3 Grade View: Grade I Tube type: Oral Tube size: 7.0 mm Number of attempts: 1 Airway Equipment and Method: Stylet and Oral airway Placement Confirmation: ETT inserted through vocal cords under direct vision,  positive ETCO2 and breath sounds checked- equal and bilateral Secured at: 21 cm Tube secured with: Tape Dental Injury: Teeth and Oropharynx as per pre-operative assessment

## 2016-08-06 NOTE — Progress Notes (Signed)
Patient is alert and oriented.  Pain is controlled, patient has tolerated ice chips. Reviewed post op diet in detail with patient and spouse.  Reviewed Adjustable gastric band discharge instructions with patient and spouse, able to articulate understanding.  Provided information on BELT program, Support Group and [facility] outpatient pharmacy. All questions answered, pt being discharged from short stay.  Will give patient a follow up call on 08/07/16.    Quenton FetterMisha  RN

## 2016-08-06 NOTE — H&P (Signed)
Catherine ShanksHolly Lewis is an 31 y.o. female.   Chief Complaint: here for surgery HPI: 31 year old morbidly obese female comes in today for elective laparoscopic adjustable gastric band surgery. She has undergone required supervised weight loss and has been approved for surgery. She denies any medical changes since I last saw in the clinic in mid May. Her preoperative upper GI showed a small hiatal hernia but she only had some mild reflux while pregnant.  Past Medical History:  Diagnosis Date  . History of hiatal hernia     History reviewed. No pertinent surgical history.  History reviewed. No pertinent family history. Social History:  reports that she quit smoking about 11 years ago. Her smoking use included Cigarettes. She has never used smokeless tobacco. She reports that she drinks alcohol. She reports that she does not use drugs.  Allergies: No Known Allergies  Medications Prior to Admission  Medication Sig Dispense Refill  . Ascorbic Acid (VITAMIN C) 500 MG CHEW Chew 1 each by mouth See admin instructions. 2 times a month    . ketoconazole (NIZORAL) 2 % cream Apply 1 application topically daily.    . Multiple Vitamin (MULTIVITAMIN WITH MINERALS) TABS tablet Take 1 tablet by mouth daily.    . norgestimate-ethinyl estradiol (SPRINTEC 28) 0.25-35 MG-MCG tablet Take 1 tablet by mouth daily.      Results for orders placed or performed during the hospital encounter of 08/06/16 (from the past 48 hour(s))  Pregnancy, urine STAT morning of surgery     Status: None   Collection Time: 08/06/16 10:08 AM  Result Value Ref Range   Preg Test, Ur NEGATIVE NEGATIVE    Comment:        THE SENSITIVITY OF THIS METHODOLOGY IS >20 mIU/mL.    No results found.  Review of Systems  Constitutional: Negative for weight loss.  HENT: Negative for nosebleeds.   Eyes: Negative for blurred vision.  Respiratory: Negative for shortness of breath.   Cardiovascular: Negative for chest pain, palpitations, orthopnea  and PND.       Denies DOE  Genitourinary: Negative for dysuria and hematuria.  Musculoskeletal: Negative.   Skin: Negative for itching and rash.  Neurological: Negative for dizziness, focal weakness, seizures, loss of consciousness and headaches.       Denies TIAs, amaurosis fugax  Endo/Heme/Allergies: Does not bruise/bleed easily.  Psychiatric/Behavioral: The patient is not nervous/anxious.     Blood pressure 138/69, pulse 86, temperature 99.1 F (37.3 C), temperature source Oral, resp. rate 20, height 5\' 6"  (1.676 m), weight 133.4 kg (294 lb), last menstrual period 07/08/2016, SpO2 100 %. Physical Exam  Vitals reviewed. Constitutional: She is oriented to person, place, and time. She appears well-developed and well-nourished. No distress.  Morbidly obese  HENT:  Head: Normocephalic and atraumatic.  Right Ear: External ear normal.  Left Ear: External ear normal.  Eyes: Conjunctivae are normal. No scleral icterus.  Neck: Normal range of motion. Neck supple. No tracheal deviation present. No thyromegaly present.  Cardiovascular: Normal rate and normal heart sounds.   Respiratory: Effort normal and breath sounds normal. No stridor. No respiratory distress. She has no wheezes.  GI: Soft. She exhibits no distension. There is no tenderness. There is no rebound.  obese  Musculoskeletal: She exhibits no edema or tenderness.  Lymphadenopathy:    She has no cervical adenopathy.  Neurological: She is alert and oriented to person, place, and time. She exhibits normal muscle tone.  Skin: Skin is warm and dry. No rash noted. She  is not diaphoretic. No erythema. No pallor.  Psychiatric: She has a normal mood and affect. Her behavior is normal. Judgment and thought content normal.     Assessment/Plan Morbid obesity  ERas protocol subcu heparin on call to OR IV abx on call to OR For lap band placement possible hiatal hernia repair Emotional support  Mary Sella. Andrey Campanile, MD, FACS General,  Bariatric, & Minimally Invasive Surgery [facility], [place]   Atilano Ina, MD 08/06/2016, 12:08 PM

## 2016-08-06 NOTE — Progress Notes (Addendum)
Patient up to recliner chair with PAS hose on.   1730  Ambulated in hallway from 1301 to 1310 and tolerated well. To bathroom and able to void.

## 2016-08-06 NOTE — Discharge Instructions (Signed)
° °                ° °ADJUSTABLE GASTRIC BAND ° Home Care Instructions ° ° These instructions are to help you care for yourself when you go home. ° °Call: If you have any problems. °• Call [phone] and ask for the surgeon on call °• If you need immediate assistance come to the ER at [place]. Tell the ER staff you are a new post-op gastric banding patient  °Signs and symptoms to report: • Severe  vomiting or nausea °o If you cannot handle clear liquids for longer than 1 day, call your surgeon °• Abdominal pain which does not get better after taking your pain medication °• Fever greater than 100.4°  F and chills °• Heart rate over 100 beats a minute °• Trouble breathing °• Chest pain °• Redness,  swelling, drainage, or foul odor at incision (surgical) sites °• If your incisions open or pull apart °• Swelling or pain in calf (lower leg) °• Diarrhea (Loose bowel movements that happen often), frequent watery, uncontrolled bowel movements °• Constipation, (no bowel movements for 3 days) if this happens: °o Take Milk of Magnesia, 2 tablespoons by mouth, 3 times a day for 2 days if needed °o Stop taking Milk of Magnesia once you have had a bowel movement °o Call your doctor if constipation continues °Or °o Take Miralax  (instead of Milk of Magnesia) following the label instructions °o Stop taking Miralax once you have had a bowel movement °o Call your doctor if constipation continues °• Anything you think is “abnormal for you” °  °Normal side effects after surgery: • Unable to sleep at night or unable to concentrate °• Irritability °• Being tearful (crying) or depressed ° °These are common complaints, possibly related to your anesthesia, stress of surgery, and change in lifestyle, that usually go away a few weeks after surgery. If these feelings continue, call your medical doctor.  °Wound Care: You may have surgical glue, steri-strips, or staples over your incisions after surgery °• Surgical glue: Looks like clear  film over your incisions and will wear off a little at a time °• Steri-strips: Adhesive strips of tape over your incisions. You may notice a yellowish color on skin under the steri-strips. This is used to make the steri-strips stick better. Do not pull the steri-strips off - let them fall off °• Staples: Staples may be removed before you leave the hospital °o If you go home with staples, call [facility] for an appointment with your surgeon’s nurse to have staples removed 10 days after surgery, [phone] °• Showering: You may shower two (2) days after your surgery unless your surgeon tells you differently °o Wash gently around incisions with warm soapy water, rinse well, and gently pat dry °o If you have a drain (tube from your incision), you may need someone to hold this while you shower °o No tub baths until staples are removed and incisions are healed °  °Medications: • Medications should be liquid or crushed if larger than the size of a dime °• Extended release pills (medication that releases a little bit at a time through the  day) should not be crushed °• Depending on the size and number of medications you take, you may need to space (take a few throughout the day)/change the time you take your medications so that you do not over-fill your pouch (smaller stomach) °• Make sure you follow-up with you primary care physician   to make medication changes needed during rapid weight loss and life -style changes °• If you have diabetes, follow up with your doctor that orders your diabetes medication(s) within one week after surgery and check your blood sugar regularly ° °• Do not drive while taking narcotics (pain medications) ° °• Do not take acetaminophen (Tylenol) and Roxicet or Lortab Elixir at the same time since these pain medications contain acetaminophen °  °Diet:  °First 2 Weeks You will see the nutritionist about two (2) weeks after your surgery. The nutritionist will increase the types of  foods you can eat if you are handling liquids well: °• If you have severe vomiting or nausea and cannot handle clear liquids lasting longer than 1 day call your surgeon °For Same Day Surgery Discharge Patients: °• The day of surgery drink water only: 2 ounces every 4 hours °• If you are handling water, start drinking your high protein shake the next morning °For Overnight Stay Patients: °• Begin by drinking 2 ounces of a high protein every 3 hours, 5-6 times per day °• Slowly increase the amount you drink as tolerated °• You may find it easier to slowly sip shakes throughout the day. It is important to get your proteins in first °  ° Protein Shake °• Drink at least 2 ounces of shake 5-6 times per day °• Each serving of protein shakes (usually 8-12 ounces) should have a minimum of: °o 15 grams of protein °o And no more than 5 grams of carbohydrate °• Goal for protein each day: °o Men = 80 grams per day °o Women = 60 grams per day °• Protein powder may be added to fluids such as non-fat milk or Lactaid milk or Soy milk (limit to 35 grams added protein powder per serving) ° °Hydration °• Slowly increase the amount of water and other clear liquids as tolerated (See Acceptable Fluids) °• Slowly increase the amount of protein shake as tolerated °• Sip fluids slowly and throughout the day °• May use sugar substitutes in small amounts (no more than 6-8 packets per day; i.e. Splenda) ° °Fluid Goal °• The first goal is to drink at least 8 ounces of protein shake/drink per day (or as directed by the nutritionist); some examples of protein shakes are Syntrax, Nectar, Adkins Advantage, EAS Edge HP, and Unjury. - See handout from pre-op Bariatric Education Class: °o Slowly increase the amount of protein shake you drink as tolerated °o You may find it easier to slowly sip shakes throughout the day °o It is important to get your proteins in first °• Your fluid goal is to drink 64-100 ounces of fluid daily °o It may take a few weeks  to build up to this  °• 32 oz. (or more) should be full liquids (see below for examples) °• Liquids should not contain sugar, caffeine, or carbonation ° °Clear Liquids: °• Water of Sugar-free flavored water (i.e. Fruit H²O, Propel) °• Decaffeinated coffee or tea (sugar-free) °• Crystal lite, Wyler’s Lite, Minute Maid Lite °• Sugar-free Jell-O °• Bouillon or broth °• Sugar-free Popsicle:    - Less than 20 calories each; Limit 1 per day ° ° ° ° °  ° Full Liquids: °                  Protein Shakes/Drinks + 2 choices per day of other full liquids °• Full liquids must be: °o No More Than 12 grams of Carbs per serving °o No More Than 3 grams   of Fat per serving  Strained low-fat cream soup  Non-Fat milk  Fat-free Lactaid Milk  Sugar-free yogurt (Dannon Lite & Fit, Greek yogurt)   Vitamins and Minerals  Start 1 day after surgery unless otherwise directed by your surgeon  1 Chewable Multivitamin / Multimineral Supplement with iron (i.e. Centrum for Adults)  Chewable Calcium Citrate with Vitamin D-3 (Example: 3 Chewable Calcium  Plus 600 with vitamin D-3) o Take 500 mg three (3) times a day for a total of 1500 mg per day o Do not take all 3 doses of calcium at one time as it may cause constipation, and you can only absorb 500 mg at a time o Do not mix multivitamins containing iron with calcium supplements;  take 2 hours apart o Do not substitute Tums (calcium carbonate) for your calcium  Menstruating women and those at risk for anemia ( a blood disease that causes weakness) may need extra iron o Talk to your doctor to see if you need more iron  If you need extra iron: total daily iron recommendation (including Vitamins) is 50 to 100 mg Iron/day  Do not stop taking or change any vitamins or minerals until you talk to your nutritionist or surgeon  Your nutritionist and/or surgeon must approve all vitamin and mineral supplements  Activity and Exercise: It is important to continue walking at home.  Limit your physical activity as instructed by your doctor. During this time, use these guidelines:  Do not lift anything greater than ten  (10) pounds for at least two (2) weeks  Do not go back to work or drive until Engineer, production says you can  You may have sex when you feel comfortable o It is VERY important for female patients to use a reliable birth control method; fertility often increase after surgery o Do not get pregnant for at least 18 months  Start exercising as soon as your doctor tells you that you can o Make sure your doctor approves any physical activity  Start with a simple walking program  Walk 5-15 minutes each day, 7 days per week  Slowly increase until you are walking 30-45 minutes per day  Consider joining our [place] program. [phone] or email [email or web address]    Special Instructions  Things to remember:  Free counseling is available for you and your family through collaboration between [facility] and [facility]. Please call [phone] and leave a message  Use your CPAP when sleeping if this applies to you  Consider buying a medical alert bracelet that says you had lap-band surgery  You will likely have your first fill (fluid added to your band) 6 - 8 weeks after surgery  [facility] has a free Bariatric Surgery Support Group that meets monthly, the 3rd Thursday, [place]. You can see classes online at [email or web address]  It is very important to keep all follow up appointments with your surgeon, nutritionist, primary care physician, and behavioral health practitioner o After the first year, please follow up with your bariatric surgeon and nutritionist at least once a year in order to maintain best weight loss results                    [facility]:  [place]: [phone]               Bariatric Nurse Coordinator:  [phone]      Adjustable Gastric Band Home Care Instructions  Rev. 02/2012                                                                  Reviewed and Endorsed                                                   by [facility] Patient Education Committee, Jan, 2014 General Anesthesia, Adult, Care After These instructions provide you with information about caring for yourself after your procedure. Your health care provider may also give you more specific instructions. Your treatment has been planned according to current medical practices, but problems sometimes occur. Call your health care provider if you have any problems or questions after your procedure. What can I expect after the procedure? After the procedure, it is common to have:  Vomiting.  A sore throat.  Mental slowness.  It is common to feel:  Nauseous.  Cold or shivery.  Sleepy.  Tired.  Sore or achy, even in parts of your body where you did not have surgery.  Follow these instructions at home: For at least 24 hours after the procedure:  Do not: ? Participate in activities where you could fall or become injured. ? Drive. ? Use heavy machinery. ? Drink alcohol. ? Take sleeping pills or medicines that cause drowsiness. ? Make important decisions or sign legal documents. ? Take care of children on your own.  Rest. Eating and drinking  If you vomit, drink water, juice, or soup when you can drink without vomiting.  Drink enough fluid to keep your urine clear or pale yellow.  Make sure you have little or no nausea before eating solid foods.  Follow the diet recommended by your health care provider. General instructions  Have a responsible adult stay with you until you are awake and alert.  Return to your normal activities as told by your health care provider. Ask your health care provider what activities are safe for you.  Take over-the-counter and prescription medicines only as told by your health care  provider.  If you smoke, do not smoke without supervision.  Keep all follow-up visits as told by your health care provider. This is important. Contact a health care provider if:  You continue to have nausea or vomiting at home, and medicines are not helpful.  You cannot drink fluids or start eating again.  You cannot urinate after 8-12 hours.  You develop a skin rash.  You have fever.  You have increasing redness at the site of your procedure. Get help right away if:  You have difficulty breathing.  You have chest pain.  You have unexpected bleeding.  You feel that you are having a life-threatening or urgent problem. This information is not intended to replace advice given to you by your health care provider. Make sure you discuss any questions you have with your health care provider. Document Released: 04/22/2000 Document Revised: 06/19/2015 Document Reviewed: 12/29/2014 Elsevier Interactive Patient Education  [employer].  ADJUSTABLE GASTRIC BAND  Home Care Instructions   These instructions are to help you care for yourself when you go home.  Call: If you have any problems.  Call [phone] and ask for the surgeon on call  If you need immediate assistance [facility]. Tell the ER staff you are a new post-op gastric banding patient  Signs and symptoms to report:  Severe  vomiting or nausea o If you cannot handle clear liquids for longer than 1 day, call your surgeon  Abdominal pain which does not get better after taking your pain medication  Fever greater than 100.4  F and chills  Heart rate over 100 beats a minute  Trouble breathing  Chest pain  Redness,  swelling, drainage, or foul odor at incision (surgical) sites  If your incisions open or pull apart  Swelling or pain in calf (lower leg)  Diarrhea (Loose bowel movements that happen often), frequent watery, uncontrolled bowel movements  Constipation,  (no bowel movements for 3 days) if this happens: o Take Milk of Magnesia, 2 tablespoons by mouth, 3 times a day for 2 days if needed o Stop taking Milk of Magnesia once you have had a bowel movement o Call your doctor if constipation continues Or o Take Miralax  (instead of Milk of Magnesia) following the label instructions o Stop taking Miralax once you have had a bowel movement o Call your doctor if constipation continues  Anything you think is abnormal for you   Normal side effects after surgery:  Unable to sleep at night or unable to concentrate  Irritability  Being tearful (crying) or depressed  These are common complaints, possibly related to your anesthesia, stress of surgery, and change in lifestyle, that usually go away a few weeks after surgery. If these feelings continue, call your medical doctor.  Wound Care: You may have surgical glue, steri-strips, or staples over your incisions after surgery  Surgical glue: Looks like clear film over your incisions and will wear off a little at a time  Steri-strips: Adhesive strips of tape over your incisions. You may notice a yellowish color on skin under the steri-strips. This is used to make the steri-strips stick better. Do not pull the steri-strips off - let them fall off  Staples: Staples may be removed before you leave the hospital o If you go home with staples, call [facility] for an appointment with your surgeons nurse to have staples removed 10 days after surgery, [phone]  Showering: You may shower two (2) days after your surgery unless your surgeon tells you differently o Wash gently around incisions with warm soapy water, rinse well, and gently pat dry o If you have a drain (tube from your incision), you may need someone to hold this while you shower o No tub baths until staples are removed and incisions are healed   Medications:  Medications should be liquid or crushed if larger than the size of a  dime  Extended release pills (medication that releases a little bit at a time through the  day) should not be crushed  Depending on the size and number of medications you take, you may need to space (take a few throughout the day)/change the time you take your medications so that you do not over-fill your pouch (smaller stomach)  Make sure you follow-up with you primary care physician to make medication changes needed during rapid weight loss and life -style changes  If you have  diabetes, follow up with your doctor that orders your diabetes medication(s) within one week after surgery and check your blood sugar regularly   Do not drive while taking narcotics (pain medications)   Do not take acetaminophen (Tylenol) and Roxicet or Lortab Elixir at the same time since these pain medications contain acetaminophen   Diet:  First 2 Weeks You will see the nutritionist about two (2) weeks after your surgery. The nutritionist will increase the types of foods you can eat if you are handling liquids well:  If you have severe vomiting or nausea and cannot handle clear liquids lasting longer than 1 day call your surgeon For Same Day Surgery Discharge Patients:  The day of surgery drink water only: 2 ounces every 4 hours  If you are handling water, start drinking your high protein shake the next morning For Overnight Stay Patients:  Begin by drinking 2 ounces of a high protein every 3 hours, 5-6 times per day  Slowly increase the amount you drink as tolerated  You may find it easier to slowly sip shakes throughout the day. It is important to get your proteins in first    Protein Shake  Drink at least 2 ounces of shake 5-6 times per day  Each serving of protein shakes (usually 8-12 ounces) should have a minimum of: o 15 grams of protein o And no more than 5 grams of carbohydrate  Goal for protein each day: o Men = 80 grams per day o Women = 60 grams per day  Protein powder may be added  to fluids such as non-fat milk or Lactaid milk or Soy milk (limit to 35 grams added protein powder per serving)  Hydration  Slowly increase the amount of water and other clear liquids as tolerated (See Acceptable Fluids)  Slowly increase the amount of protein shake as tolerated  Sip fluids slowly and throughout the day  May use sugar substitutes in small amounts (no more than 6-8 packets per day; i.e. Splenda)  Fluid Goal  The first goal is to drink at least 8 ounces of protein shake/drink per day (or as directed by the nutritionist); some examples of protein shakes are Syntrax, Nectar, [employer]illard[employer], EAS Edge HP, and Unjury. - See handout from pre-op Bariatric Education Class: o Slowly increase the amount of protein shake you drink as tolerated o You may find it easier to slowly sip shakes throughout the day o It is important to get your proteins in first  Your fluid goal is to drink 64-100 ounces of fluid daily o It may take a few weeks to build up to this   32 oz. (or more) should be full liquids (see below for examples)  Liquids should not contain sugar, caffeine, or carbonation  Clear Liquids:  Water of Sugar-free flavored water (i.e. Fruit HO, Propel)  Decaffeinated coffee or tea (sugar-free)  Crystal lite, Wylers Lite, Minute Maid Lite  Sugar-free Jell-O  Bouillon or broth  Sugar-free Popsicle:    - Less than 20 calories each; Limit 1 per day        Full Liquids:                   Protein Shakes/Drinks + 2 choices per day of other full liquids  Full liquids must be: o No More Than 12 grams of Carbs per serving o No More Than 3 grams of Fat per serving  Strained low-fat cream soup  Non-Fat milk  Fat-free Lactaid Milk  Sugar-free  yogurt (Dannon Lite & Fit, Greek yogurt)   Vitamins and Minerals  Start 1 day after surgery unless otherwise directed by your surgeon  1 Chewable Multivitamin / Multimineral Supplement with iron (i.e. Centrum for  Adults)  Chewable Calcium Citrate with Vitamin D-3 (Example: 3 Chewable Calcium  Plus 600 with vitamin D-3) o Take 500 mg three (3) times a day for a total of 1500 mg per day o Do not take all 3 doses of calcium at one time as it may cause constipation, and you can only absorb 500 mg at a time o Do not mix multivitamins containing iron with calcium supplements;  take 2 hours apart o Do not substitute Tums (calcium carbonate) for your calcium  Menstruating women and those at risk for anemia ( a blood disease that causes weakness) may need extra iron o Talk to your doctor to see if you need more iron  If you need extra iron: total daily iron recommendation (including Vitamins) is 50 to 100 mg Iron/day  Do not stop taking or change any vitamins or minerals until you talk to your nutritionist or surgeon  Your nutritionist and/or surgeon must approve all vitamin and mineral supplements  Activity and Exercise: It is important to continue walking at home. Limit your physical activity as instructed by your doctor. During this time, use these guidelines:  Do not lift anything greater than ten  (10) pounds for at least two (2) weeks  Do not go back to work or drive until Designer, industrial/product says you can  You may have sex when you feel comfortable o It is VERY important for female patients to use a reliable birth control method; fertility often increase after surgery o Do not get pregnant for at least 18 months  Start exercising as soon as your doctor tells you that you can o Make sure your doctor approves any physical activity  Start with a simple walking program  Walk 5-15 minutes each day, 7 days per week  Slowly increase until you are walking 30-45 minutes per day  Consider joining our [employer] program. [phone] or email [email or web address]    Special Instructions  Things to remember:  Free counseling is available for you and your family through collaboration between [facility] and [place].  Please call [phone] and leave a message  Use your CPAP when sleeping if this applies to you  Consider buying a medical alert bracelet that says you had lap-band surgery  You will likely have your first fill (fluid added to your band) 6 - 8 weeks after surgery  [facility] has a free Bariatric Surgery Support Group that meets monthly, the 3rd Thursday, 6pm. [place]. You can see classes online at [email or web address]  It is very important to keep all follow up appointments with your surgeon, nutritionist, primary care physician, and behavioral health practitioner o After the first year, please follow up with your bariatric surgeon and nutritionist at least once a year in order to maintain best weight loss results                    [facility]:  [phone]               [facility]: [phone]               Bariatric Nurse Coordinator: [phone]      Adjustable Gastric Band Home Care Instructions  Rev. 02/2012  Reviewed and Endorsed                                                   by [facility] Patient Education Committee, Jan, 2014

## 2016-08-06 NOTE — Op Note (Signed)
Dorleen Lewis [id number] 1985/04/04 08/06/2016  Laparoscopic Adjustable Gastric Band with hiatal hernia repair Placement Operative Note   Pre-operative Diagnosis: Morbid Obesity (BMI 48) Chronic right foot pain Hiatal hernia  Post-operative Diagnosis: same  Surgeon: Atilano Ina   Assistants: Feliciana Rossetti  Anesthesia: General endotracheal anesthesia  Anesthesia: General plus exparel  Indications: mrorbid Obesity unresponsive to medical treatment.  Findings: AP-Standard band,   EBL : Minimal  Procedure Details  The patient was seen in the Holding Room. The risks, benefits, complications, treatment options, and expected outcomes were discussed with the patient. The possibilities of reaction to medication, pulmonary aspiration, perforation of viscus, bleeding, recurrent infection, the need for additional procedures, failure to diagnose a condition, and creating a complication requiring transfusion or operation were discussed with the patient. The patient concurred with the proposed plan, giving informed consent.   The patient was taken to Operating Room # 2, identified as Catherine Lewis and the procedure verified as Laparoscopic Adjustable Gastric Band Placement with possible hiatal hernia repair. A Time Out was held and the above information confirmed.  Full general anesthesia was induced with orotracheal intubation.  The patient was prepped and draped in a supine position. Appropriate antibiotics were given intravenously.  A 1cm incision was made 2 fingerbreadths below the left subcostal margin . Opitview technique was used to gain entry to the abdominal cavity. A 5 mm blunt trocar was advanced under direct vision through the abdominal wall with a 0 degree scope. The abdomen was insufflated, the laparoscope introduced.  There were no untoward findings on diagnostic laparoscopy. Trocars were placed under direct vision in the following fashion: a 5mm trocar in the lateral right upper  quadrant, a 15mm trocar in the right upper quadrant, a 5mm trocar in the high epigastrium, and one 5mm trocar slightly above and to the left of the umbilicus. The patient was placed in reverse trendelenburg position.  The [facility] liver retractor was then placed through the high epigastric trocar site and positioned to hold the liver. Exparel was then infiltrated in bilateral upper abdominal lateral walls as a TAP Block  A calibration tube was passed down the oropharynx and into the stomach. There appeared to be a gap between the left and right crus of the diaphragm. 10cc of air was inflated into the balloon and then the calibration tube was gently pulled back toward the GE junction. There was no resistance at the GE junciton/hiatus. There was a dimple. Therefore I felt there was evidence of a hiatal hernia.  At this point the calibration tubing was desufflated and pulled back into the esophagus. This confirmed my suspicion of a clinically significant hiatal hernia. The gastrohepatic ligament was incised with harmonic scalpel. The right crus was identified. We identified the crossing fat along the right crus. The adipose tissue just above this area was incised with harmonic scalpel. I then bluntly dissected out this area and identified the left crus. There was evidence of a hiatal hernia. I then mobilized the esophagus. The left and right crus were further mobilized with blunt dissection. I was then able to reapproximate the left and right crus with 0 Ethibond using an Endostitch suture device and securing it with a titanium tyknot. I placed a second suture in a similar fashion. We then had the CRNA readvanced the calibration tubing back into the stomach. 10 mL of air was insufflated into the calibration tube balloon. The calibration tube was then gently pulled back and there was resistance at the GE junction. The  tube did not slide back up into the esophagus.    The angle of His was identified and the left  crus was dissected free.  Approximately 8cm below the angle of His on the lesser curvature, passing through the pars flaccida and preserving the vagus nerve, a blunt instrument was gently passed anterior to the right crus and behind the gastro-esophageal junction without difficulty. Care was taken to minimize posterior dissection in order to prevent a posterior slip.   A AP standard Lap Band was introduced into the abdominal cavity through the 15mm trocar and carried around the gastro-esophageal junction and locked onto itself. Three interrupted 2.0 Ethibond sutures (each secured with a titanium tie knot) were used to imbricate the anterior stomach to itself over the band to prevent anterior slippage.   The bowel was examined and there were no obvious lesions. Hemostasis was verified. The liver retractor was removed under direct visualization. There was no evidence of liver injury. The tubing from the band was brought out via the right upper quadrant 15mm trocar site. All trocars were then removed under direct vision. The skin incision was lengthened and a subcutaneous space was made to accommodate the port. A 1 inch square of vicryl mesh was anchored to the base of the port with 4 sutures. The port was attached to the tubing and then placed in the subcutaneous pocket.  The redundant tubing was advanced back into the abdominal cavity. Inverted interrupted deep dermal sutures using a 2-0 vicryl were placed.   The wounds were heavily irrigated. The skin incisions were closed with 4-0 monocryl. Benzoin, Steri-Strips and bandages was applied.   Instrument, sponge, and needle counts were correct prior to wound closure and at the conclusion of the case.          Complications:  None; patient tolerated the procedure well.                Condition: stable  [name], MD, FACS General, Bariatric, & Minimally Invasive Surgery [facility], [place]

## 2016-08-06 NOTE — Transfer of Care (Signed)
Immediate Anesthesia Transfer of Care Note  Patient: Catherine ShanksHolly Lewis  Procedure(s) Performed: Procedure(s): LAPAROSCOPIC GASTRIC BANDING WITH HIATAL HERNIA REPAIR (N/A)  Patient Location: PACU  Anesthesia Type:General  Level of Consciousness: awake, alert  and oriented  Airway & Oxygen Therapy: Patient Spontanous Breathing and Patient connected to face mask oxygen  Post-op Assessment: Report given to RN and Post -op Vital signs reviewed and stable  Post vital signs: Reviewed and stable  Last Vitals:  Vitals:   08/06/16 1104  BP: 138/69  Pulse: 86  Resp: 20  Temp: 37.3 C    Last Pain:  Vitals:   08/06/16 1104  TempSrc: Oral         Complications: No apparent anesthesia complications

## 2016-08-06 NOTE — Anesthesia Preprocedure Evaluation (Addendum)
Anesthesia Evaluation  Patient identified by MRN, date of birth, ID band Patient awake    Reviewed: Allergy & Precautions, NPO status , Patient's Chart, lab work & pertinent test results  Airway Mallampati: I   Neck ROM: Full    Dental  (+) Teeth Intact, Dental Advisory Given   Pulmonary former smoker,    breath sounds clear to auscultation       Cardiovascular negative cardio ROS   Rhythm:Regular Rate:Normal     Neuro/Psych negative neurological ROS  negative psych ROS   GI/Hepatic Neg liver ROS, hiatal hernia,   Endo/Other  negative endocrine ROS  Renal/GU negative Renal ROS  negative genitourinary   Musculoskeletal negative musculoskeletal ROS (+)   Abdominal   Peds negative pediatric ROS (+)  Hematology negative hematology ROS (+)   Anesthesia Other Findings   Reproductive/Obstetrics negative OB ROS                            Anesthesia Physical Anesthesia Plan  ASA: III  Anesthesia Plan: General   Post-op Pain Management:    Induction: Intravenous  PONV Risk Score and Plan: 4 or greater and Ondansetron, Dexamethasone, Propofol, Midazolam and Scopolamine patch - Pre-op  Airway Management Planned: Oral ETT  Additional Equipment:   Intra-op Plan:   Post-operative Plan: Extubation in OR  Informed Consent: I have reviewed the patients History and Physical, chart, labs and discussed the procedure including the risks, benefits and alternatives for the proposed anesthesia with the patient or authorized representative who has indicated his/her understanding and acceptance.   Dental advisory given  Plan Discussed with: CRNA  Anesthesia Plan Comments:         Anesthesia Quick Evaluation

## 2016-08-07 ENCOUNTER — Encounter (HOSPITAL_COMMUNITY): Payer: Self-pay | Admitting: General Surgery

## 2016-08-07 NOTE — Anesthesia Postprocedure Evaluation (Signed)
Anesthesia Post Note  Patient: Catherine ShanksHolly Lewis  Procedure(s) Performed: Procedure(s) (LRB): LAPAROSCOPIC GASTRIC BANDING WITH HIATAL HERNIA REPAIR (N/A)     Patient location during evaluation: PACU Anesthesia Type: General Level of consciousness: awake and alert Pain management: pain level controlled Vital Signs Assessment: post-procedure vital signs reviewed and stable Respiratory status: spontaneous breathing, nonlabored ventilation, respiratory function stable and patient connected to nasal cannula oxygen Cardiovascular status: blood pressure returned to baseline and stable Postop Assessment: no signs of nausea or vomiting Anesthetic complications: no    Last Vitals:  Vitals:   08/06/16 1530 08/06/16 1827  BP: (!) 146/73 126/76  Pulse: 81 75  Resp: 18   Temp: 36.8 C 37.1 C    Last Pain:  Vitals:   08/06/16 1827  TempSrc:   PainSc: 1                  Shelton SilvasKevin D 

## 2016-08-15 ENCOUNTER — Telehealth (HOSPITAL_COMMUNITY): Payer: Self-pay

## 2016-08-15 NOTE — Telephone Encounter (Signed)
Made discharge phone call to patientAsking the following questions.    1. Do you have someone to care for you now that you are home?  independent 2. Are you having pain now that is not relieved by your pain medication?  Not needed 3. Are you able to drink the recommended daily amount of fluids (48 ounces minimum/day) and protein (60-80 grams/day) as prescribed by the dietitian or nutritional counselor?  yes 4. Are you taking the vitamins and minerals as prescribed?  yes 5. Do you have the "on call" number to contact your surgeon if you have a problem or question?  yes 6. Are your incisions free of redness, swelling or drainage? (If steri strips, address that these can fall off, shower as tolerated) yes 7. Have your bowels moved since your surgery?  If not, are you passing gas?  yes 8. Are you up and walking 3-4 times per day?  yes 9. Were you provided your discharge medications before your surgery or before you were discharged from the hospital and are you taking them without problem? Yes 10.

## 2016-08-19 ENCOUNTER — Encounter: Payer: Managed Care, Other (non HMO) | Attending: General Surgery | Admitting: Skilled Nursing Facility1

## 2016-08-19 ENCOUNTER — Encounter: Payer: Self-pay | Admitting: Skilled Nursing Facility1

## 2016-08-19 DIAGNOSIS — Z713 Dietary counseling and surveillance: Secondary | ICD-10-CM | POA: Insufficient documentation

## 2016-08-19 DIAGNOSIS — E669 Obesity, unspecified: Secondary | ICD-10-CM

## 2016-08-19 NOTE — Progress Notes (Signed)
Bariatric Class:  Appt start time: 1530 end time:  1630.  2 Week Post-Operative Nutrition Class  Patient was seen on 08/19/2016 for Post-[facility]ative Nutrition [facility]   Pt states she is very constipated with milk of magnesia and stool softener had a bowel movement. Pt states she struggles with her mother not cooking healthily and undermining her authority with her daughter but she feel sit may be getting better.   Surgery date: 08/06/2016 Surgery type: Lap band [facility] weight at [facility]: 288.5 pounds Weight today: 281.6  TANITA  BODY COMP RESULTS  08/19/2016   BMI (kg/m^2) 45.5   Fat Mass (lbs) 151   Fat Free Mass (lbs) 130.6   Total Body Water (lbs) 97.6   The following the learning objectives were met by the patient during this course:  Identifies Phase 3A (Soft, High Proteins) Dietary Goals and will begin from 2 weeks post-operatively to 2 months post-operatively  Identifies appropriate sources of fluids and proteins   States protein recommendations and appropriate sources post-operatively  Identifies the need for appropriate texture modifications, mastication, and bite sizes when consuming solids  Identifies appropriate multivitamin and calcium sources post-operatively  Describes the need for physical activity post-operatively and will follow MD recommendations  States when to call healthcare provider regarding medication questions or post-operative complications  Handouts given during class include:  Phase 3A: Soft, High Protein Diet Handout  Follow-Up Plan: Patient will follow-up at [facility] in 6 weeks for 2 month post-op nutrition visit for diet advancement per MD.

## 2016-08-20 ENCOUNTER — Ambulatory Visit: Payer: Managed Care, Other (non HMO)

## 2016-08-26 ENCOUNTER — Encounter: Payer: Managed Care, Other (non HMO) | Attending: General Surgery | Admitting: Dietician

## 2016-08-26 ENCOUNTER — Encounter: Payer: Self-pay | Admitting: Dietician

## 2016-08-26 DIAGNOSIS — Z029 Encounter for administrative examinations, unspecified: Secondary | ICD-10-CM | POA: Diagnosis not present

## 2016-08-26 NOTE — Patient Instructions (Signed)
   Increase physical activity by adding other exercises and duration as tolerated.  Increase fluids to 80oz or more to help alleviate and avoid further constipation.   After 4 weeks post-op, you can try adding 1-2 bites of softly cooked vegetable (see list) if you have room after eating 1-2oz protein.

## 2016-08-26 NOTE — Progress Notes (Signed)
Medical Nutrition Therapy: Visit start time: 1330  end time: 1400  Assessment:  Diagnosis: post-op bariatric lap band surgery Medical history changes: no changes Psychosocial issues/ stress concerns: none  Current weight: 281.4lbs  Height: 5'6" Medications, supplement changes: reconciled list in medical record  Progress and evaluation: Patient has lost about 25lbs since her previous visit at this office on 05/07/16. She denies any significant difficulties since having surgery; occasional pain after swallowing which is corrected by standing up, or small sips of water. She does report constipation, which she has also [facility] with MD[facility]. She using saucer to eat, reports 64oz fluid daily, no sodas, decaf flavored tea, tracking protein intake and achieving goal of 60grams daily.  Physical activity: walking 15 minutes daily, occasional stair climbing at home, will begin elliptical and treadmill in next few days  Dietary Intake:  Usual eating pattern includes 3 meals and 2-3 snacks per day. Dining out frequency: 0 meals per week.  Breakfast: [place] sausage, 2 patties, or scrambled eggs Snack: cheese stick or seafood Lunch: crabmeat, seafood, 2oz filet mignon, salmon, chicken (harder to eat), with [employer] dressing Snack: crabmeat or lowfat polly-o cheese stick Supper: same as lunch Snack: 8-8:30 Oikos blended [place] yogurt, followed by walking Beverages: water, flavored water  Nutrition Care Education: Topics covered: bariatric diet for lap band Bariatric diet: Reviewed patient's diet progress since surgery and current food and fluid intake. Discussed strategies to deal with and prevent constipation. Instructed on Stage 3 and 4 of bariatric diet, emphasizing importance of eating protein first, introducing new foods one at a time. Discussed expectations for weight loss and long term success strategies.  Nutritional Diagnosis:  [place]-3.3 Overweight/obesity As related to history  of weight fluctuation and excess calories.  As evidenced by BMI 46.7.  Intervention: Discussion as noted above.   Set goals with direction from patient.   Scheduled follow-up 2 weeks after next MD appointment; will check body composition.  Education Materials given:  . Stage 4 Diet (AND) . Phase 3A diet [facility]) . Goals/ instructions  Learner/ who was taught:  . Patient   Level of understanding: Marland Kitchen. Verbalizes/ demonstrates competency  Demonstrated degree of understanding via:   Teach back Learning barriers: . None  Willingness to learn/ readiness for change: . Eager, change in progress  Monitoring and Evaluation:  Dietary intake, exercise, and body weight      follow up: 10/07/16

## 2016-10-07 ENCOUNTER — Encounter: Payer: Managed Care, Other (non HMO) | Attending: General Surgery | Admitting: Dietician

## 2016-10-07 DIAGNOSIS — Z713 Dietary counseling and surveillance: Secondary | ICD-10-CM | POA: Diagnosis not present

## 2016-10-07 DIAGNOSIS — Z6841 Body Mass Index (BMI) 40.0 and over, adult: Secondary | ICD-10-CM | POA: Diagnosis not present

## 2016-10-07 NOTE — Progress Notes (Signed)
Medical Nutrition Therapy: Visit start time: 1330  end time: 1400  Assessment:  Diagnosis: obesity, post-gastric lap band surgery Medical history changes: none per patient Psychosocial issues/ stress concerns: none  Current weight: 267.6lbs  Height: 5'6" Medications, supplement changes: reconciled list in medical record  Progress and evaluation: Patient reports some discouragement in slower-than-expected rate of weight loss, states MD visit was positive and helped her feel better about her progress. She has progressed to eating most meats, although still has some trouble with chicken feeling stuck after swallowing. She is eating cooked vegetables and occasionally small amount of fruit. She reports fluid intake typically close to 64oz per day.   Physical activity: treadmill, elliptical, resistance bands 4 times a weeks; walks dogs daily; focused on increasing movement throughout the day.   Dietary Intake:  Usual eating pattern includes 3 meals and 1-2 snacks per day. Dining out frequency: 0-1 meals per week.  Breakfast: Vilma MeckelJimmy Dean frittata spinach, bacon or [place]key sausage and cheese, (2) Snack: not typically Lunch: deli meat, healthy choice without pasta or rice Snack: [place] Oikos yogurt no sugar added Supper: protein and vegetables some difficulty with chicken, strictly limiting potatoes.  Snack: rarely sugar free vanilla wafers (1) Beverages: water 16-20oz + additional 20oz throughout the day + powerade zero and/or flavored water.   Nutrition Care Education: Topics covered: bariatric diet Basic nutrition: appropriate nutrient balance    Bariatric diet: Stage 4 bariatric diet, lifelong diet and supplementation, weight loss expectations, additional diet advancement to eventually include whole grains and fruits.   Nutritional Diagnosis:  [place]-3.3 Overweight/obesity As related to history of excess calories and inactivity.  As evidenced by BMI 43, patient report.  Intervention: Discussion as  noted above.   Commended patient for good diet compliance as well as regular exercise.    She is motivated to continue with bariatric diet and supplementation.    She will schedule additional RD follow-up at a later time.   Education Materials given:   800-1200kcal bariatric menus (AND)  Learner/ who was taught:  . Patient   Level of understanding: Marland Kitchen. Verbalizes/ demonstrates competency  Demonstrated degree of understanding via:   Teach back Learning barriers: . None  Willingness to learn/ readiness for change: . Eager, change in progress  Monitoring and Evaluation:  Dietary intake, exercise, and body weight      follow up: prn

## 2017-03-12 ENCOUNTER — Other Ambulatory Visit: Payer: Self-pay | Admitting: General Surgery

## 2017-03-12 DIAGNOSIS — R109 Unspecified abdominal pain: Secondary | ICD-10-CM

## 2017-03-13 ENCOUNTER — Ambulatory Visit
Admission: RE | Admit: 2017-03-13 | Discharge: 2017-03-13 | Disposition: A | Discharge status: Home / Self Care | Payer: BLUE CROSS/BLUE SHIELD | Source: Ambulatory Visit | Attending: General Surgery | Admitting: General Surgery

## 2017-03-13 DIAGNOSIS — Z9884 Bariatric surgery status: Secondary | ICD-10-CM | POA: Diagnosis not present

## 2017-03-13 DIAGNOSIS — R109 Unspecified abdominal pain: Secondary | ICD-10-CM | POA: Diagnosis present

## 2017-09-16 IMAGING — RF DG UGI W/ KUB
7 of 11 series · 7 of 11 positions shown · non-contrast
Comparison: None.

CLINICAL DATA: Pre bariatric screening

EXAM:
UPPER GI SERIES WITH KUB
TECHNIQUE: After obtaining a scout radiograph a routine upper GI series was
performed using thin and high density barium. Effervescent crystals
and a barium tablet were administered.
FLUOROSCOPY TIME:  Fluoroscopy Time:  0 minutes, 54 seconds
Radiation Exposure Index (if provided by the fluoroscopic device):
2091.20 uGy m2
Number of Acquired Spot Images: 9

[Series 1: t abdomen supine · 0.15mm/px · 1 of 1 slices shown (1 of 2)]
[im 1/1]
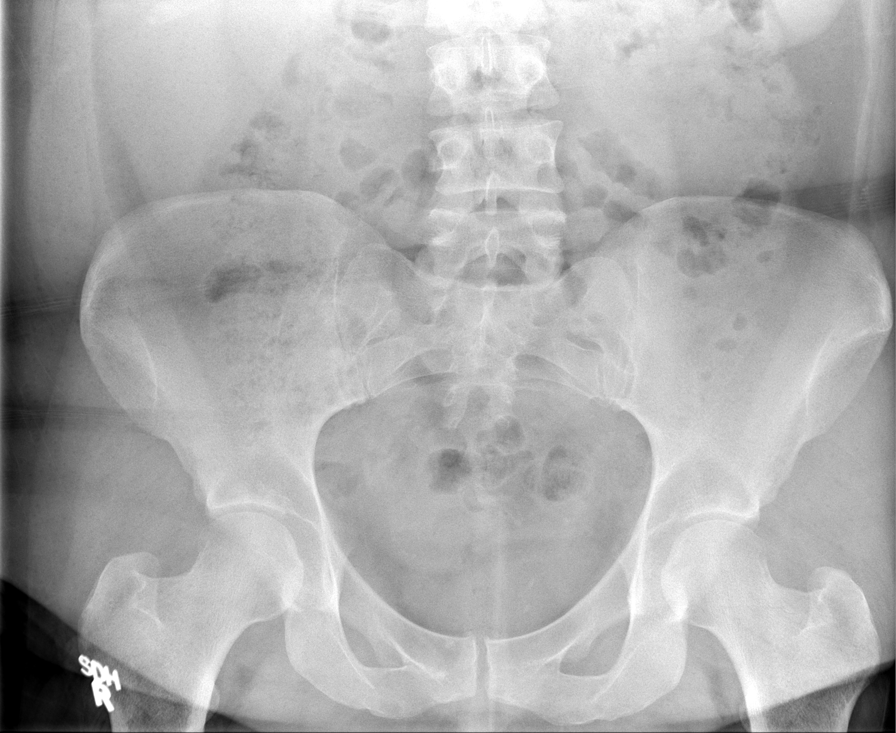

[Series 2: t abdomen supine · 0.15mm/px · 1 of 1 slices shown (2 of 2)]
[im 1/1]
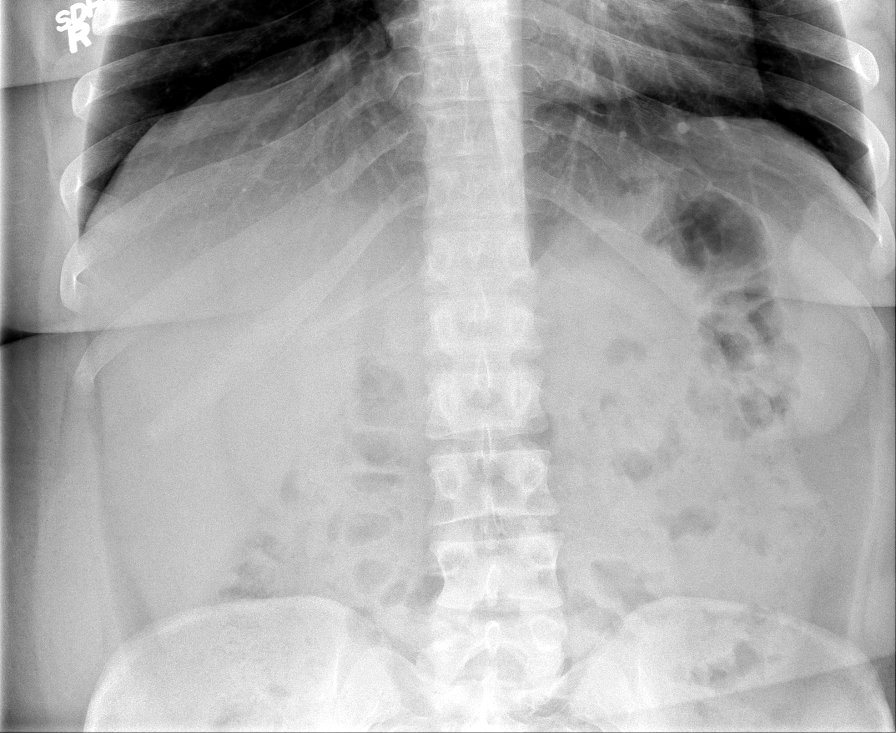

[Series 3: fluoro_barium singleshot_bw · 0.17mm/px · 1 of 1 slices shown (1 of 5)]
[im 1/1]
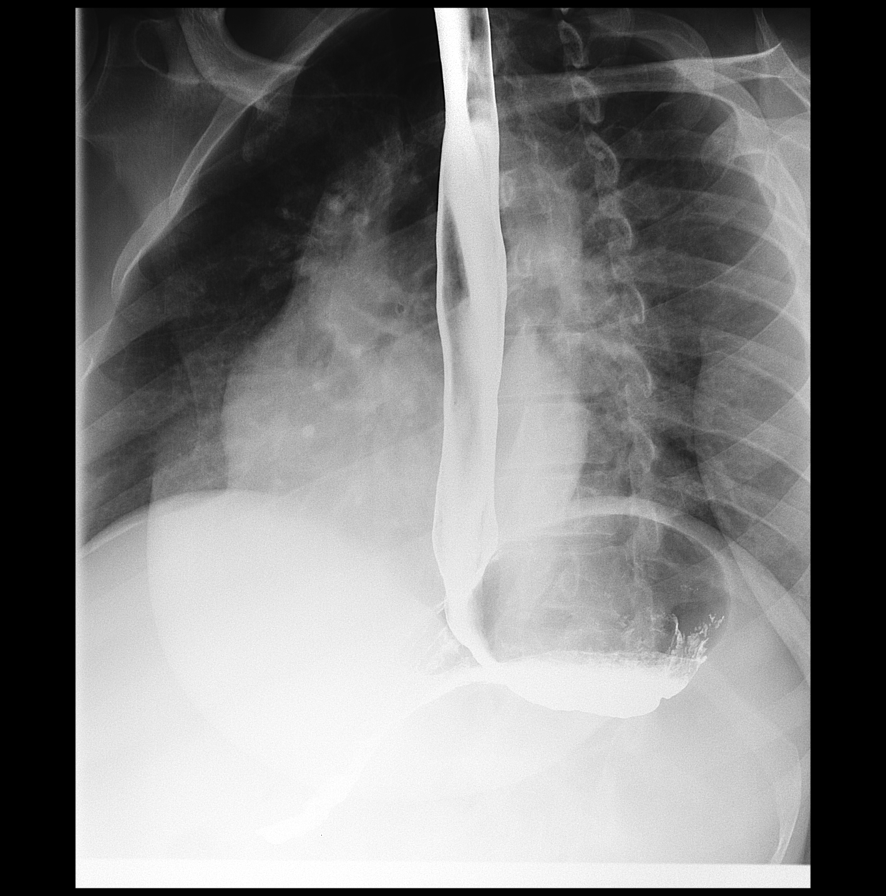

[Series 4: fluoro_barium singleshot_bw · 0.18mm/px · 1 of 1 slices shown (2 of 5)]
[im 1/1]
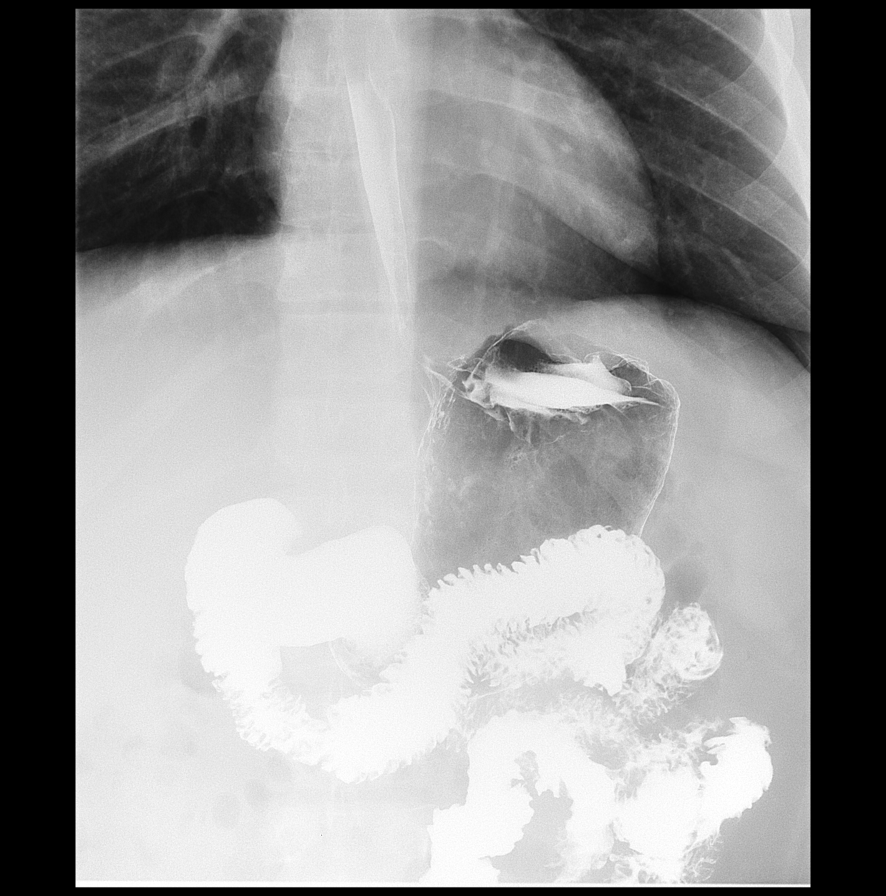

[Series 5: fluoro_barium singleshot_bw · 0.18mm/px · 1 of 1 slices shown (3 of 5)]
[im 1/1]
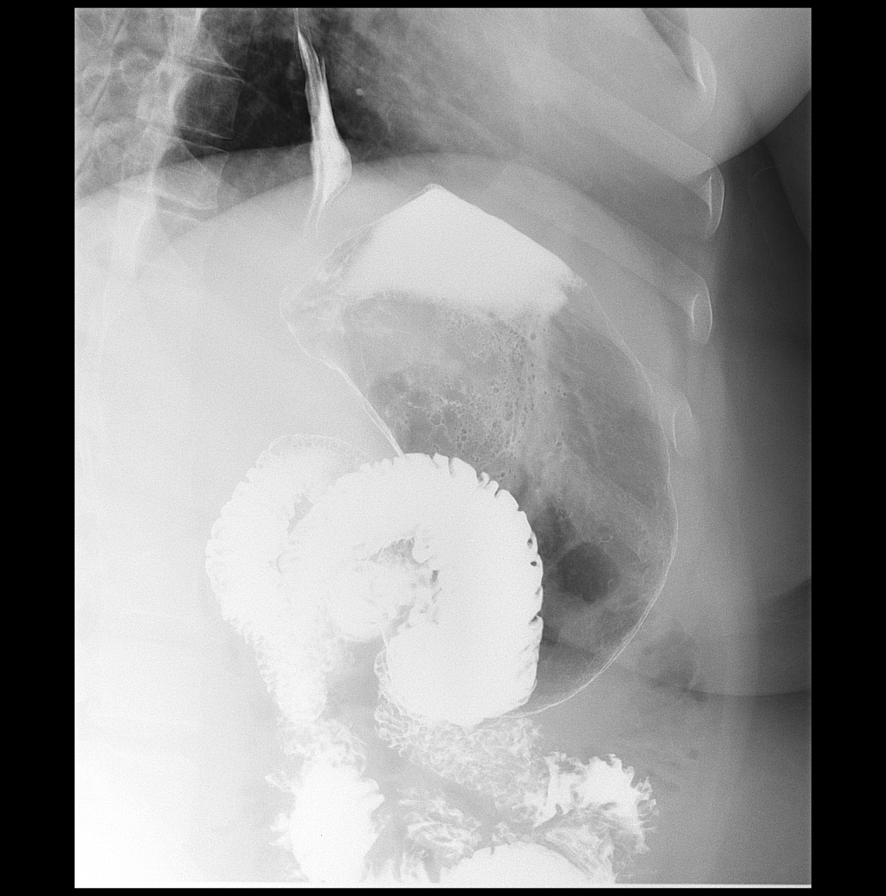

[Series 6: fluoro_barium singleshot_bw · 0.18mm/px · 1 of 1 slices shown (4 of 5)]
[im 1/1]
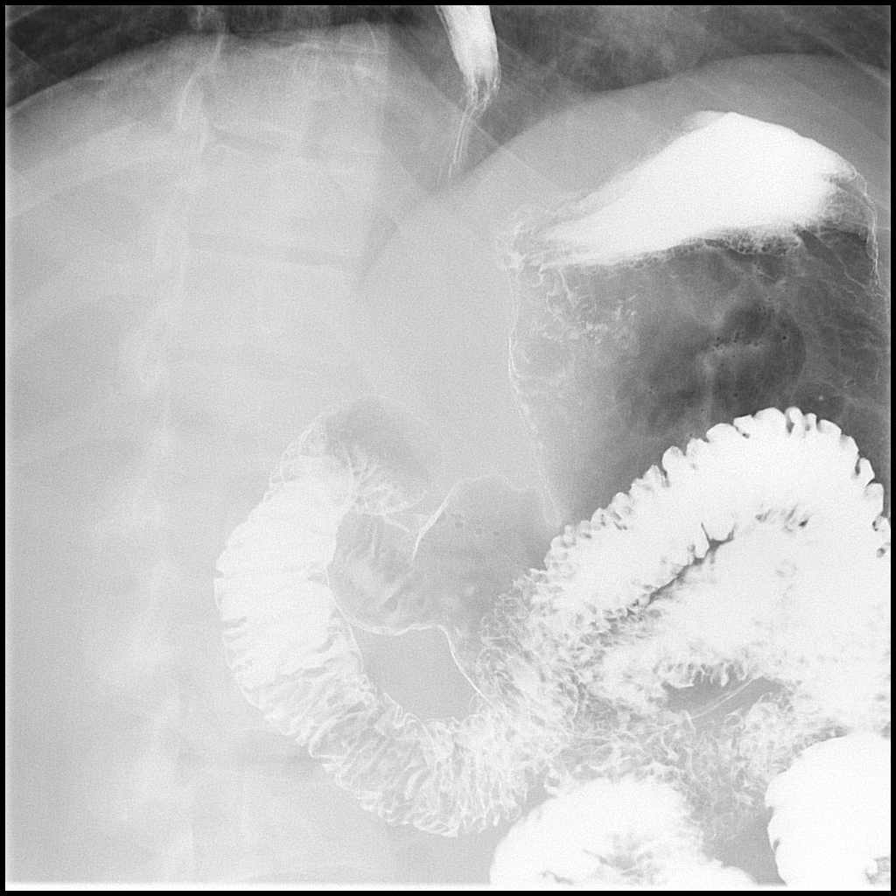

[Series 7: fluoro_barium singleshot_bw · 0.18mm/px · 1 of 1 slices shown (5 of 5)]
[im 1/1]
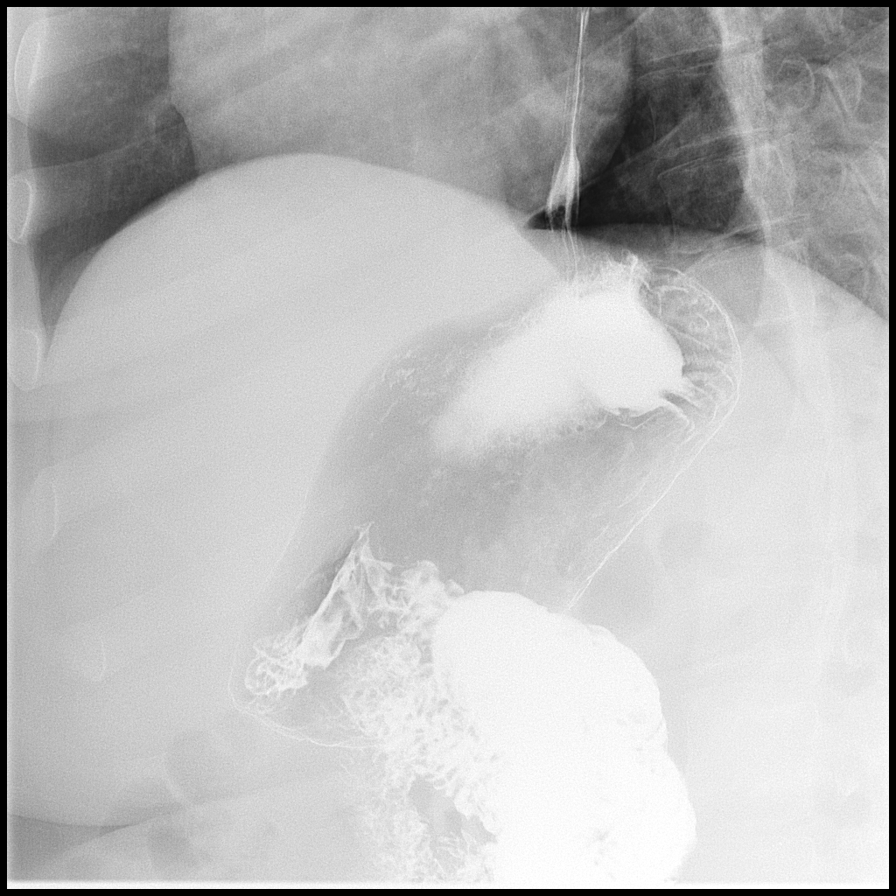

[7 of 11 positions shown; findings below may reference images not displayed]

FINDINGS: The bowel gas pattern on the scout film is normal. There are no
abnormal soft tissue calcifications. The lung bases are clear.

The patient ingested the thick and thin barium and gas forming
crystals without difficulty. The thoracic esophagus distended well.
There was small reducible hiatal hernia. A small amount of
gastroesophageal reflux was observed. The 12 mm barium pill passed
without difficulty. There was no evidence of stricture nor
esophagitis.

The stomach distended well. The mucosal fold pattern was normal.
Gastric emptying was prompt. The duodenal bulb and C-loop were
normal in appearance and position.
IMPRESSION: Small hiatal hernia with small amount of gastroesophageal reflux. No
evidence of esophagitis.

Normal appearance of the stomach and duodenum

## 2018-06-22 IMAGING — CR DG ABDOMEN 1V
1 series · 1 of 1 positions shown · non-contrast
Comparison: None.

CLINICAL DATA: Abdominal pain

EXAM:
ABDOMEN - 1 VIEW

[t abdomen supine *]
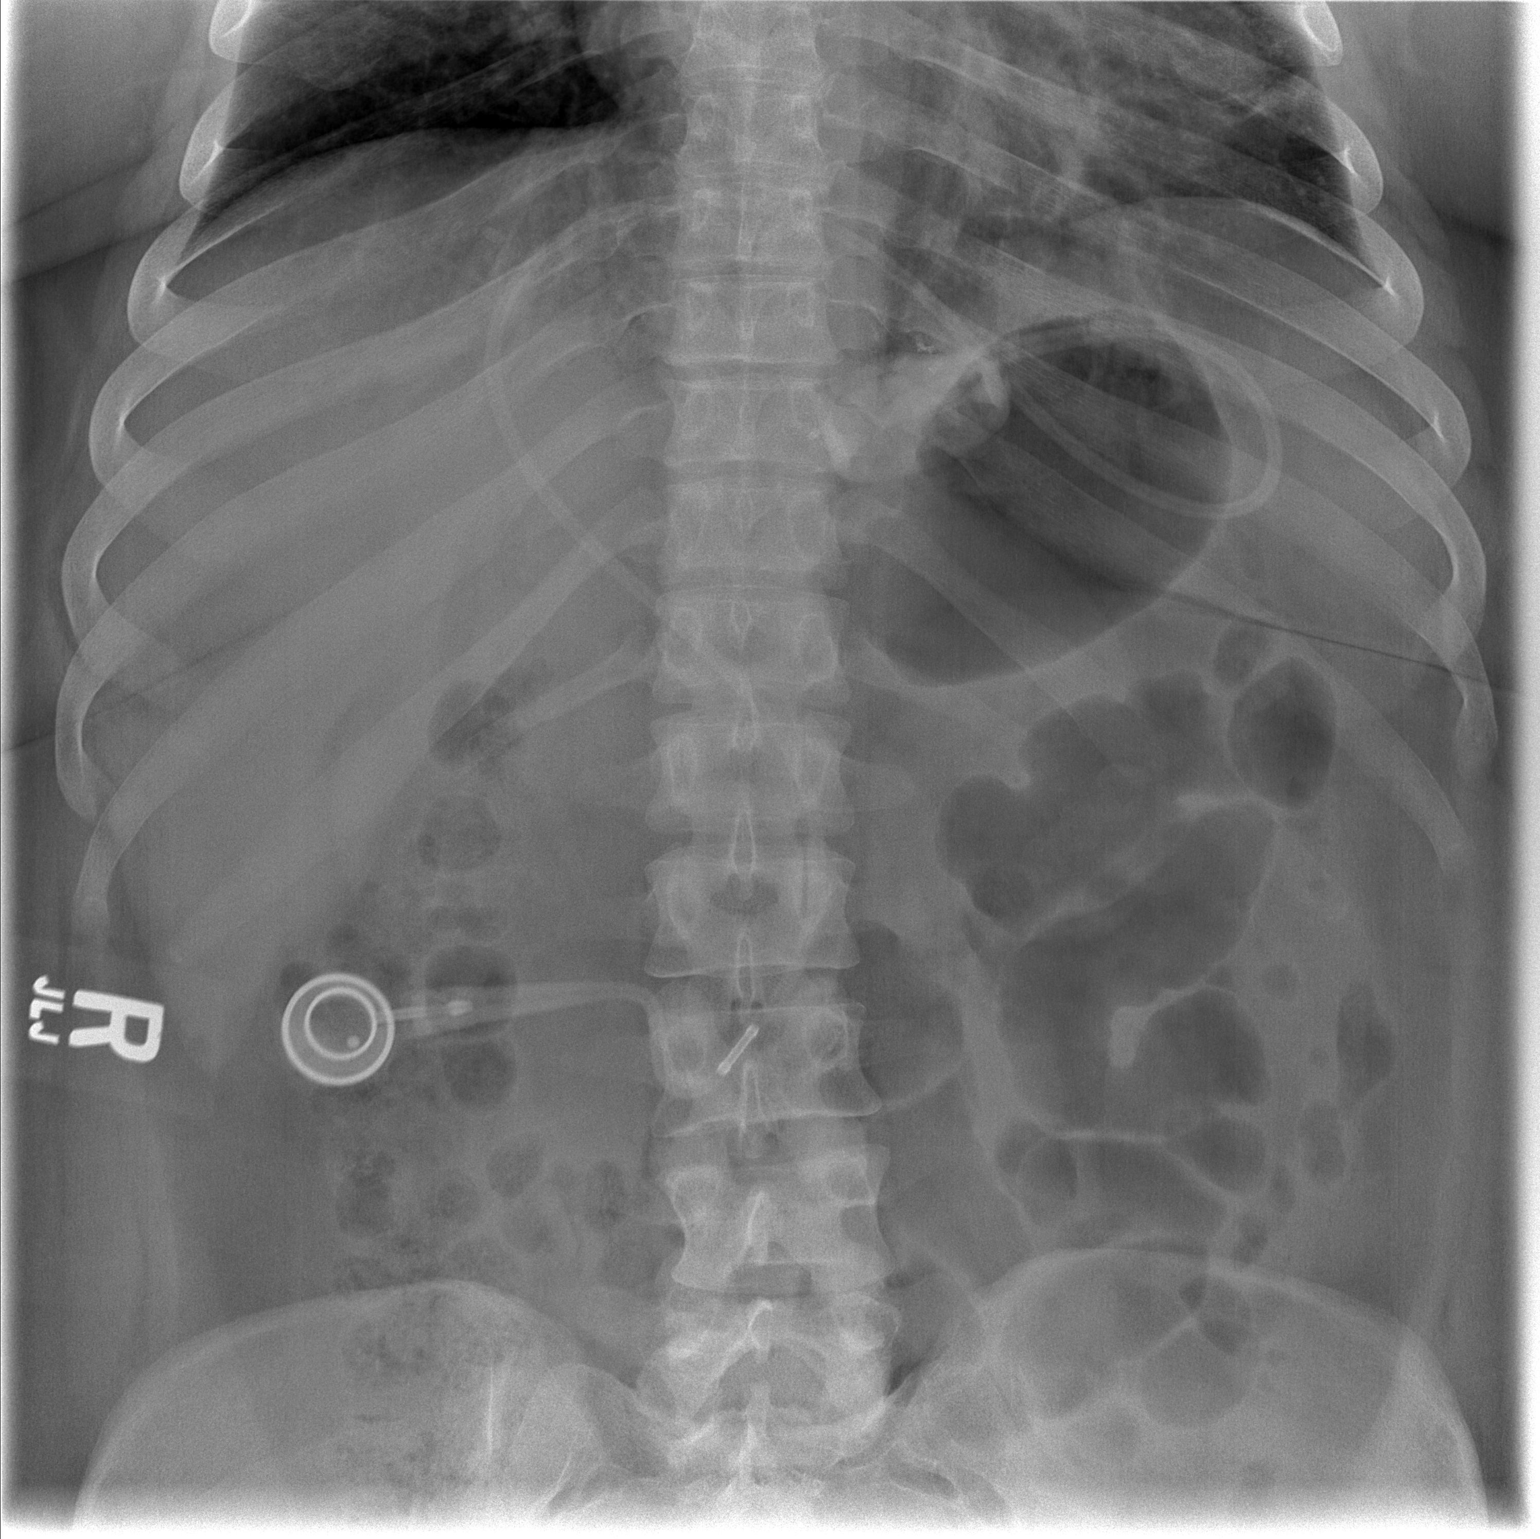

[1 of 1 positions shown; findings below may reference images not displayed]

FINDINGS: Lap band in place, appropriately positioned.

Visualized bowel gas pattern is nonobstructive. No evidence of soft
tissue mass or abnormal fluid collection. No evidence of free
intraperitoneal air seen. No acute or suspicious osseous finding.
IMPRESSION: Lap band appears appropriately positioned. No evidence of procedural
complicating feature.

These results were called by telephone at the time of interpretation
on 08/06/2016 at [DATE] to Dr. CIILOOW MAA , who verbally
acknowledged these results.

## 2019-02-16 ENCOUNTER — Encounter (HOSPITAL_COMMUNITY): Payer: Self-pay

## 2019-09-21 ENCOUNTER — Other Ambulatory Visit: Payer: Self-pay | Admitting: Podiatry

## 2019-09-29 ENCOUNTER — Inpatient Hospital Stay: Admission: RE | Admit: 2019-09-29 | Payer: BLUE CROSS/BLUE SHIELD | Source: Ambulatory Visit

## 2019-09-30 ENCOUNTER — Encounter
Admission: RE | Admit: 2019-09-30 | Discharge: 2019-09-30 | Disposition: A | Discharge status: Home / Self Care | Payer: 59 | Source: Ambulatory Visit | Attending: Podiatry | Admitting: Podiatry

## 2019-09-30 ENCOUNTER — Other Ambulatory Visit: Payer: Self-pay

## 2019-09-30 NOTE — Patient Instructions (Signed)
Your procedure is scheduled on: October 08, 2019 FRIDAY Report to Day Surgery on the 2nd floor of the [facility] To find out your arrival time, please call [phone] between 1PM - 3PM on: Thursday 9, 2021  REMEMBER: Instructions that are not followed completely may result in serious medical risk, up to and including death; or upon the discretion of your surgeon and anesthesiologist your surgery may need to be rescheduled.  Do not eat food after midnight the night before surgery.  No gum chewing, lozengers or hard candies.  You may however, drink CLEAR liquids up to 2 hours before you are scheduled to arrive for your surgery. Do not drink anything within 2 hours of your scheduled arrival time.  Clear liquids include: - water  - apple juice without pulp - gatorade (not RED) - black coffee or tea (Do NOT add milk or creamers to the coffee or tea) Do NOT drink anything that is not on this list.  Type 1 and Type 2 diabetics should only drink water.  In addition, your doctor has ordered for you to drink the provided  Ensure Pre-Surgery Clear Carbohydrate Drink  Drinking this carbohydrate drink up to two hours before surgery helps to reduce insulin resistance and improve patient outcomes. Please complete drinking 2 hours prior to scheduled arrival time.  TAKE THESE MEDICATIONS THE MORNING OF SURGERY WITH A SIP OF WATER: none  Follow recommendations from Cardiologist, Pulmonologist or PCP regarding stopping Aspirin, Coumadin, Plavix, Eliquis, Pradaxa, or Pletal.  One week prior to surgery: Stop Anti-inflammatories (NSAIDS) such as Advil, Aleve, Ibuprofen, Motrin, Naproxen, Naprosyn and Aspirin based products such as Excedrin, Goodys Powder, BC Powder. Stop ANY OVER THE COUNTER supplements until after surgery. (You may continue taking Tylenol, Vitamin D, Vitamin B, and multivitamin.)  No Alcohol for 24 hours before or after surgery.  No Smoking including e-cigarettes for 24  hours prior to surgery.  No chewable tobacco products for at least 6 hours prior to surgery.  No nicotine patches on the day of surgery.  Do not use any "recreational" drugs for at least a week prior to your surgery.  Please be advised that the combination of cocaine and anesthesia may have negative outcomes, up to and including death. If you test positive for cocaine, your surgery will be cancelled.  On the morning of surgery brush your teeth with toothpaste and water, you may rinse your mouth with mouthwash if you wish. Do not swallow any toothpaste or mouthwash.  Do not wear jewelry, make-up, hairpins, clips or nail polish.  Do not wear lotions, powders, or perfumes.   Do not shave 48 hours prior to surgery.   Contact lenses, hearing aids and dentures may not be worn into surgery.  Do not bring valuables to the hospital. [facility] is not responsible for any missing/lost belongings or valuables.   Use CHG Soap as directed on instruction sheet.  Notify your doctor if there is any change in your medical condition (cold, fever, infection).  Wear comfortable clothing (specific to your surgery type) to the hospital.  Plan for stool softeners for home use; pain medications have a tendency to cause constipation. You can also help prevent constipation by eating foods high in fiber such as fruits and vegetables and drinking plenty of fluids as your diet allows.  After surgery, you can help prevent lung complications by doing breathing exercises.  Take deep breaths and cough every 1-2 hours. Your doctor may order a device called an Incentive  Spirometer to help you take deep breaths. When coughing or sneezing, hold a pillow firmly against your incision with both hands. This is called "splinting." Doing this helps protect your incision. It also decreases belly discomfort.  If you are being discharged the day of surgery, you will not be allowed to drive home. You will need a responsible  adult (18 years or older) to drive you home and stay with you that night.   Please call the Pre-admissions Testing Dept. at [phone] if you have any questions about these instructions.  Visitation Policy:  Patients undergoing a surgery or procedure may have one family member or support person with them as long as that person is not COVID-19 positive or experiencing its symptoms.  That person may remain in the waiting area during the procedure.  Inpatient Visitation Update:   In an effort to ensure the safety of our team members and our patients, we are implementing a change to our visitation policy:  Effective Monday, Aug. 9, at 7 a.m., inpatients will be allowed one support person.  o The support person may change daily.  o The support person must pass our screening, gel in and out, and wear a mask at all times, including in the patient's room.  o Patients must also wear a mask when staff or their support person are in the room.  o Masking is required regardless of vaccination status.  Systemwide, no visitors 17 or younger.

## 2019-10-06 ENCOUNTER — Other Ambulatory Visit: Payer: Self-pay

## 2019-10-06 ENCOUNTER — Other Ambulatory Visit
Admission: RE | Admit: 2019-10-06 | Discharge: 2019-10-06 | Disposition: A | Discharge status: Home / Self Care | Payer: 59 | Source: Ambulatory Visit | Attending: Podiatry | Admitting: Podiatry

## 2019-10-06 DIAGNOSIS — Z01812 Encounter for preprocedural laboratory examination: Secondary | ICD-10-CM | POA: Insufficient documentation

## 2019-10-06 DIAGNOSIS — Z20822 Contact with and (suspected) exposure to covid-19: Secondary | ICD-10-CM | POA: Insufficient documentation

## 2019-10-06 LAB — SARS CORONAVIRUS 2 (TAT 6-24 HRS): SARS Coronavirus 2: NEGATIVE

## 2019-10-07 MED ORDER — CEFAZOLIN SODIUM-DEXTROSE 2-4 GM/100ML-% IV SOLN
2.0000 g | INTRAVENOUS | Status: AC
  Administered 2019-10-08: 3 g via INTRAVENOUS

## 2019-10-08 ENCOUNTER — Encounter: Payer: Self-pay | Admitting: Podiatry

## 2019-10-08 ENCOUNTER — Ambulatory Visit
Admission: RE | Admit: 2019-10-08 | Discharge: 2019-10-08 | Disposition: A | Discharge status: Home / Self Care | Payer: 59 | Attending: Podiatry | Admitting: Podiatry

## 2019-10-08 ENCOUNTER — Encounter
Admission: RE | Disposition: A | Discharge status: Home / Self Care | Payer: Self-pay | Source: Home / Self Care | Attending: Podiatry

## 2019-10-08 ENCOUNTER — Other Ambulatory Visit: Payer: Self-pay

## 2019-10-08 ENCOUNTER — Ambulatory Visit: Payer: 59 | Admitting: Certified Registered"

## 2019-10-08 ENCOUNTER — Ambulatory Visit: Payer: 59

## 2019-10-08 DIAGNOSIS — M7732 Calcaneal spur, left foot: Secondary | ICD-10-CM | POA: Insufficient documentation

## 2019-10-08 DIAGNOSIS — Z87891 Personal history of nicotine dependence: Secondary | ICD-10-CM | POA: Diagnosis not present

## 2019-10-08 DIAGNOSIS — Z6841 Body Mass Index (BMI) 40.0 and over, adult: Secondary | ICD-10-CM | POA: Insufficient documentation

## 2019-10-08 DIAGNOSIS — Z9884 Bariatric surgery status: Secondary | ICD-10-CM | POA: Insufficient documentation

## 2019-10-08 DIAGNOSIS — M722 Plantar fascial fibromatosis: Secondary | ICD-10-CM | POA: Insufficient documentation

## 2019-10-08 HISTORY — PX: FASCIECTOMY: SHX6525

## 2019-10-08 LAB — POCT PREGNANCY, URINE: Preg Test, Ur: NEGATIVE

## 2019-10-08 SURGERY — FASCIECTOMY, PALM
Anesthesia: General | Laterality: Left

## 2019-10-08 MED ORDER — FENTANYL CITRATE (PF) 100 MCG/2ML IJ SOLN
INTRAMUSCULAR | Status: DC | PRN
Start: 2019-10-08 — End: 2019-10-08
  Administered 2019-10-08: 25 ug via INTRAVENOUS
  Administered 2019-10-08: 50 ug via INTRAVENOUS
  Administered 2019-10-08: 25 ug via INTRAVENOUS

## 2019-10-08 MED ORDER — CEFAZOLIN SODIUM 1 G IJ SOLR
INTRAMUSCULAR | Status: AC
  Filled 2019-10-08: qty 20

## 2019-10-08 MED ORDER — CHLORHEXIDINE GLUCONATE 0.12 % MT SOLN: 15.0000 mL | Freq: Once | OROMUCOSAL | Status: AC

## 2019-10-08 MED ORDER — GLYCOPYRROLATE 0.2 MG/ML IJ SOLN
INTRAMUSCULAR | Status: DC | PRN
  Administered 2019-10-08: .2 mg via INTRAVENOUS

## 2019-10-08 MED ORDER — MIDAZOLAM HCL 2 MG/2ML IJ SOLN
INTRAMUSCULAR | Status: AC
  Filled 2019-10-08: qty 2

## 2019-10-08 MED ORDER — ORAL CARE MOUTH RINSE: 15.0000 mL | Freq: Once | OROMUCOSAL | Status: AC

## 2019-10-08 MED ORDER — SUCCINYLCHOLINE CHLORIDE 20 MG/ML IJ SOLN
INTRAMUSCULAR | Status: DC | PRN
  Administered 2019-10-08: 140 mg via INTRAVENOUS

## 2019-10-08 MED ORDER — ONDANSETRON HCL 4 MG/2ML IJ SOLN
INTRAMUSCULAR | Status: AC
  Filled 2019-10-08: qty 2

## 2019-10-08 MED ORDER — PHENYLEPHRINE HCL (PRESSORS) 10 MG/ML IV SOLN
INTRAVENOUS | Status: AC
  Filled 2019-10-08: qty 1

## 2019-10-08 MED ORDER — CEFAZOLIN SODIUM-DEXTROSE 2-4 GM/100ML-% IV SOLN
INTRAVENOUS | Status: AC
  Filled 2019-10-08: qty 100

## 2019-10-08 MED ORDER — OXYCODONE HCL 5 MG/5ML PO SOLN: 5.0000 mg | Freq: Once | ORAL | Status: DC | PRN

## 2019-10-08 MED ORDER — SUCCINYLCHOLINE CHLORIDE 200 MG/10ML IV SOSY
PREFILLED_SYRINGE | INTRAVENOUS | Status: AC
  Filled 2019-10-08: qty 10

## 2019-10-08 MED ORDER — OXYCODONE HCL 5 MG PO TABS: 5.0000 mg | ORAL_TABLET | ORAL | 0 refills | Status: DC | PRN

## 2019-10-08 MED ORDER — PROPOFOL 10 MG/ML IV BOLUS
INTRAVENOUS | Status: DC | PRN
  Administered 2019-10-08: 260 mg via INTRAVENOUS

## 2019-10-08 MED ORDER — OXYCODONE HCL 5 MG PO TABS: 5.0000 mg | ORAL_TABLET | Freq: Once | ORAL | Status: DC | PRN

## 2019-10-08 MED ORDER — BUPIVACAINE HCL (PF) 0.5 % IJ SOLN
INTRAMUSCULAR | Status: DC | PRN
  Administered 2019-10-08: 17 mL

## 2019-10-08 MED ORDER — FENTANYL CITRATE (PF) 100 MCG/2ML IJ SOLN
INTRAMUSCULAR | Status: AC
  Filled 2019-10-08: qty 2

## 2019-10-08 MED ORDER — ROCURONIUM BROMIDE 100 MG/10ML IV SOLN
INTRAVENOUS | Status: DC | PRN
  Administered 2019-10-08: 20 mg via INTRAVENOUS

## 2019-10-08 MED ORDER — PROMETHAZINE HCL 25 MG/ML IJ SOLN
INTRAMUSCULAR | Status: AC
  Filled 2019-10-08: qty 1

## 2019-10-08 MED ORDER — PROPOFOL 10 MG/ML IV BOLUS
INTRAVENOUS | Status: AC
  Filled 2019-10-08: qty 40

## 2019-10-08 MED ORDER — PROMETHAZINE HCL 25 MG/ML IJ SOLN
6.2500 mg | INTRAMUSCULAR | Status: DC | PRN
  Administered 2019-10-08: 6.25 mg via INTRAVENOUS

## 2019-10-08 MED ORDER — CHLORHEXIDINE GLUCONATE 0.12 % MT SOLN
OROMUCOSAL | Status: AC
  Administered 2019-10-08: 15 mL via OROMUCOSAL
  Filled 2019-10-08: qty 15

## 2019-10-08 MED ORDER — LIDOCAINE HCL (PF) 2 % IJ SOLN
INTRAMUSCULAR | Status: AC
  Filled 2019-10-08: qty 5

## 2019-10-08 MED ORDER — SODIUM CHLORIDE FLUSH 0.9 % IV SOLN
INTRAVENOUS | Status: AC
  Filled 2019-10-08: qty 10

## 2019-10-08 MED ORDER — ROCURONIUM BROMIDE 10 MG/ML (PF) SYRINGE
PREFILLED_SYRINGE | INTRAVENOUS | Status: AC
  Filled 2019-10-08: qty 10

## 2019-10-08 MED ORDER — GLYCOPYRROLATE 0.2 MG/ML IJ SOLN
INTRAMUSCULAR | Status: AC
  Filled 2019-10-08: qty 1

## 2019-10-08 MED ORDER — DEXAMETHASONE SODIUM PHOSPHATE 10 MG/ML IJ SOLN
INTRAMUSCULAR | Status: DC | PRN
  Administered 2019-10-08: 8 mg via INTRAVENOUS

## 2019-10-08 MED ORDER — LACTATED RINGERS IV SOLN: INTRAVENOUS | Status: DC

## 2019-10-08 MED ORDER — EPHEDRINE 5 MG/ML INJ
INTRAVENOUS | Status: AC
  Filled 2019-10-08: qty 10

## 2019-10-08 MED ORDER — DEXAMETHASONE SODIUM PHOSPHATE 10 MG/ML IJ SOLN
INTRAMUSCULAR | Status: AC
  Filled 2019-10-08: qty 1

## 2019-10-08 MED ORDER — FENTANYL CITRATE (PF) 100 MCG/2ML IJ SOLN: 25.0000 ug | INTRAMUSCULAR | Status: DC | PRN

## 2019-10-08 MED ORDER — ONDANSETRON HCL 4 MG/2ML IJ SOLN
INTRAMUSCULAR | Status: DC | PRN
  Administered 2019-10-08: 4 mg via INTRAVENOUS

## 2019-10-08 MED ORDER — LIDOCAINE HCL (CARDIAC) PF 100 MG/5ML IV SOSY
PREFILLED_SYRINGE | INTRAVENOUS | Status: DC | PRN
  Administered 2019-10-08: 100 mg via INTRAVENOUS

## 2019-10-08 MED ORDER — SUGAMMADEX SODIUM 200 MG/2ML IV SOLN
INTRAVENOUS | Status: DC | PRN
  Administered 2019-10-08: 200 mg via INTRAVENOUS

## 2019-10-08 MED ORDER — MIDAZOLAM HCL 2 MG/2ML IJ SOLN
INTRAMUSCULAR | Status: DC | PRN
  Administered 2019-10-08: 2 mg via INTRAVENOUS

## 2019-10-08 MED ORDER — POVIDONE-IODINE 10 % EX SWAB
2.0000 "application " | Freq: Once | CUTANEOUS | Status: AC
  Administered 2019-10-08: 2 via TOPICAL

## 2019-10-08 MED ORDER — DEXMEDETOMIDINE (PRECEDEX) IN NS 20 MCG/5ML (4 MCG/ML) IV SYRINGE
PREFILLED_SYRINGE | INTRAVENOUS | Status: AC
  Filled 2019-10-08: qty 5

## 2019-10-08 MED ORDER — DEXMEDETOMIDINE (PRECEDEX) IN NS 20 MCG/5ML (4 MCG/ML) IV SYRINGE
PREFILLED_SYRINGE | INTRAVENOUS | Status: DC | PRN
  Administered 2019-10-08: 4 ug via INTRAVENOUS

## 2019-10-08 SURGICAL SUPPLY — 40 items
BNDG CONFORM 2 STRL LF (GAUZE/BANDAGES/DRESSINGS) IMPLANT
BNDG CONFORM 3 STRL LF (GAUZE/BANDAGES/DRESSINGS) IMPLANT
BNDG ELASTIC 4X5.8 VLCR NS LF (GAUZE/BANDAGES/DRESSINGS) ×6 IMPLANT
BNDG ESMARK 4X12 TAN STRL LF (GAUZE/BANDAGES/DRESSINGS) ×3 IMPLANT
BNDG GAUZE 4.5X4.1 6PLY STRL (MISCELLANEOUS) ×3 IMPLANT
CANISTER SUCT 1200ML W/VALVE (MISCELLANEOUS) ×3 IMPLANT
COVER WAND RF STERILE (DRAPES) ×3 IMPLANT
CUFF TOURN SGL QUICK 18X4 (TOURNIQUET CUFF) ×3 IMPLANT
CUFF TOURN SGL QUICK 24 (TOURNIQUET CUFF)
CUFF TRNQT CYL 24X4X16.5-23 (TOURNIQUET CUFF) IMPLANT
DURAPREP 26ML APPLICATOR (WOUND CARE) ×3 IMPLANT
ELECT REM PT RETURN 9FT ADLT (ELECTROSURGICAL) ×3
ELECTRODE REM PT RTRN 9FT ADLT (ELECTROSURGICAL) ×1 IMPLANT
GAUZE SPONGE 4X4 12PLY STRL (GAUZE/BANDAGES/DRESSINGS) ×3 IMPLANT
GAUZE XEROFORM 1X8 LF (GAUZE/BANDAGES/DRESSINGS) ×3 IMPLANT
GLOVE BIO SURGEON STRL SZ7.5 (GLOVE) ×3 IMPLANT
GLOVE INDICATOR 8.0 STRL GRN (GLOVE) ×3 IMPLANT
GOWN STRL REUS W/ TWL LRG LVL3 (GOWN DISPOSABLE) ×2 IMPLANT
GOWN STRL REUS W/TWL LRG LVL3 (GOWN DISPOSABLE) ×4
KIT TURNOVER KIT A (KITS) ×3 IMPLANT
LABEL OR SOLS (LABEL) ×3 IMPLANT
NDL SAFETY ECLIPSE 18X1.5 (NEEDLE) ×1 IMPLANT
NEEDLE HYPO 18GX1.5 SHARP (NEEDLE) ×2
NEEDLE HYPO 25X1 1.5 SAFETY (NEEDLE) ×3 IMPLANT
NS IRRIG 500ML POUR BTL (IV SOLUTION) ×3 IMPLANT
PACK EXTREMITY (MISCELLANEOUS) ×3 IMPLANT
PAD ABD DERMACEA PRESS 5X9 (GAUZE/BANDAGES/DRESSINGS) ×3 IMPLANT
PAD PREP 24X41 OB/GYN DISP (PERSONAL CARE ITEMS) IMPLANT
PENCIL ELECTRO HAND CTR (MISCELLANEOUS) ×3 IMPLANT
SPLINT CAST 1 STEP 4X30 (MISCELLANEOUS) ×3 IMPLANT
STOCKINETTE M/LG 89821 (MISCELLANEOUS) ×3 IMPLANT
STOCKINETTE STRL 6IN 960660 (GAUZE/BANDAGES/DRESSINGS) ×3 IMPLANT
SUT ETHILON 4-0 (SUTURE) ×2
SUT ETHILON 4-0 FS2 18XMFL BLK (SUTURE) ×1
SUT VIC AB 2-0 SH 27 (SUTURE) ×2
SUT VIC AB 2-0 SH 27XBRD (SUTURE) ×1 IMPLANT
SUT VIC AB 3-0 SH 27 (SUTURE) ×2
SUT VIC AB 3-0 SH 27X BRD (SUTURE) ×1 IMPLANT
SUTURE ETHLN 4-0 FS2 18XMF BLK (SUTURE) ×1 IMPLANT
SYR 10ML LL (SYRINGE) ×3 IMPLANT

## 2019-10-08 NOTE — Discharge Instructions (Addendum)
1. Elevate left lower extremity on two pillows.  2. Keep the bandage on the left foot clean, dry, and do not remove.  3. Sponge bathe only left lower extremity.  4. No weight on the left foot using crutches or walker or scooter.  5. Take one pain pill, oxycodone, every 4 hours only if needed for pain.   AMBULATORY SURGERY  DISCHARGE INSTRUCTIONS   1) The drugs that you were given will stay in your system until tomorrow so for the next 24 hours you should not:  A) Drive an automobile B) Make any legal decisions C) Drink any alcoholic beverage   2) You may resume regular meals tomorrow.  Today it is better to start with liquids and gradually work up to solid foods.  You may eat anything you prefer, but it is better to start with liquids, then soup and crackers, and gradually work up to solid foods.   3) Please notify your doctor immediately if you have any unusual bleeding, trouble breathing, redness and pain at the surgery site, drainage, fever, or pain not relieved by medication.    4) Additional Instructions:        Please contact your physician with any problems or Same Day Surgery at [phone], Monday through Friday 6 am to 4 pm, or [place] [facility] number at [phone].

## 2019-10-08 NOTE — H&P (Signed)
Subjective: Patient presents today for heel spur resection on her left foot.  Chronic pain with heel spurs on both feet but elects to proceed with surgery on the left first.  Objective: Neurovascular status intact.  No focal edema.  Pain on palpation of the proximal plantar fashion calcaneal tubercle.  X-rays reveal inferior spur off the calcaneus.  Assessment: Plantar fasciitis with heel spur left foot.  Plan: Medical history and physical in the chart was reviewed.  Patient stable to proceed with surgery for plantar fasciectomy and heel spur resection on her left foot.

## 2019-10-08 NOTE — Anesthesia Procedure Notes (Signed)
Procedure Name: Intubation Date/Time: 10/08/2019 7:46 AM Performed by: Wilder Glade, CRNA Pre-anesthesia Checklist: Patient identified, Emergency Drugs available, Suction available, Patient being monitored and Timeout performed Patient Re-evaluated:Patient Re-evaluated prior to induction Oxygen Delivery Method: Circle system utilized Preoxygenation: Pre-oxygenation with 100% oxygen Induction Type: IV induction Ventilation: Mask ventilation without difficulty Laryngoscope Size: Miller and 2 Grade View: Grade I Tube type: Oral Tube size: 7.5 mm Number of attempts: 1 (brief atraumatic ) Airway Equipment and Method: Stylet Placement Confirmation: ETT inserted through vocal cords under direct vision,  positive ETCO2 and breath sounds checked- equal and bilateral Secured at: 21 cm Tube secured with: Tape Dental Injury: Teeth and Oropharynx as per pre-operative assessment

## 2019-10-08 NOTE — Anesthesia Preprocedure Evaluation (Addendum)
Anesthesia Evaluation  Patient identified by MRN, date of birth, ID band Patient awake    Reviewed: Allergy & Precautions, H&P , NPO status , Patient's Chart, lab work & pertinent test results  History of Anesthesia Complications Negative for: history of anesthetic complications  Airway Mallampati: III  TM Distance: >3 FB Neck ROM: full    Dental  (+) Teeth Intact   Pulmonary neg sleep apnea, neg COPD, former smoker,    breath sounds clear to auscultation       Cardiovascular (-) angina(-) Past MI and (-) Cardiac Stents negative cardio ROS  (-) dysrhythmias  Rhythm:regular Rate:Normal     Neuro/Psych PSYCHIATRIC DISORDERS Anxiety negative neurological ROS     GI/Hepatic Neg liver ROS, H/o gastric banding and hiatal hernia repair   Endo/Other  Morbid obesity  Renal/GU      Musculoskeletal   Abdominal   Peds  Hematology negative hematology ROS (+)   Anesthesia Other Findings Very anxious  Past Medical History: No date: History of hiatal hernia  Past Surgical History: 2007: CESAREAN SECTION 08/06/2016: LAPAROSCOPIC GASTRIC BANDING WITH HIATAL HERNIA REPAIR; N/A     Comment:  Procedure: LAPAROSCOPIC GASTRIC BANDING WITH HIATAL               HERNIA REPAIR;  Surgeon: Gaynelle Adu, MD;  Location: [facility]               ORS;  Service: General;  Laterality: N/A;     Reproductive/Obstetrics negative OB ROS                            Anesthesia Physical Anesthesia Plan  ASA: III  Anesthesia Plan: General ETT   Post-op Pain Management:    Induction:   PONV Risk Score and Plan: Ondansetron, Dexamethasone, Midazolam and Treatment may vary due to age or medical condition  Airway Management Planned:   Additional Equipment:   Intra-op Plan:   Post-operative Plan:   Informed Consent: I have reviewed the patients History and Physical, chart, labs and discussed the procedure including the  risks, benefits and alternatives for the proposed anesthesia with the patient or authorized representative who has indicated his/her understanding and acceptance.     Dental Advisory Given  Plan Discussed with: Anesthesiologist, CRNA and Surgeon  Anesthesia Plan Comments:         Anesthesia Quick Evaluation

## 2019-10-08 NOTE — Transfer of Care (Signed)
Immediate Anesthesia Transfer of Care Note  Patient: SAMARIA ANES  Procedure(s) Performed: PARTIAL PLANTAR FASCIECTOMY WITH HEEL RESECTION OF LEFT FOOT (Left )  Patient Location: PACU  Anesthesia Type:General  Level of Consciousness: awake, alert , oriented and patient cooperative  Airway & Oxygen Therapy: Patient Spontanous Breathing and Patient connected to face mask oxygen  Post-op Assessment: Report given to RN and Post -op Vital signs reviewed and stable  Post vital signs: Reviewed and stable  Last Vitals:  Vitals Value Taken Time  BP 140/79 10/08/19 0859  Temp    Pulse 79 10/08/19 0906  Resp 14 10/08/19 0906  SpO2 100 % 10/08/19 0906  Vitals shown include unvalidated device data.  Last Pain:  Vitals:   10/08/19 0859  PainSc: (P) 0-No pain         Complications: No complications documented.

## 2019-10-08 NOTE — Interval H&P Note (Signed)
History and Physical Interval Note:  10/08/2019 7:15 AM  Catherine Lewis  has presented today for surgery, with the diagnosis of M72.2 PLANTAR FASCIITIS M77.32 CALCANEAL SPUR LEFT.  The various methods of treatment have been discussed with the patient and family. After consideration of risks, benefits and other options for treatment, the patient has consented to  Procedure(s): PLANTAR FASCIECTOMY INCIS ONLY LEFT (Left) as a surgical intervention.  [name] history has been reviewed, patient examined, no change in status, stable for surgery.  I have reviewed the patient's chart and labs.  Questions were answered to the patient's satisfaction.     Catherine Lewis

## 2019-10-08 NOTE — Op Note (Signed)
Date of operation: 10/08/2019.  Surgeon: Ricci Barker D.P.M.  Preoperative diagnosis: Chronic plantar fasciitis with heel spur left foot.  Postoperative diagnosis: Same.  Procedure: Partial plantar fasciectomy with heel spur resection left foot.  Anesthesia: General.  Hemostasis: Pneumatic tourniquet left calf 250 mmHg.  Estimated blood loss: Less than 5 cc.  Injectables: 17 cc 0.5% Marcaine plain.  Pathology: Plantar fascia left foot.  Complications: None apparent.  Operative indications: This is a 34 year old female with chronic problems with plantar fasciitis and heel spur on her left foot. Conservative care has proved unsuccessful and she elects for surgical intervention.  Operative procedure: Patient was taken to the operating room and placed on the table in the supine position. Following general anesthesia a pneumatic tourniquet was applied at the level of the left calf and the foot was prepped and draped in usual sterile fashion. The foot was exsanguinated and the tourniquet inflated to 250 mmHg.     Attention was then directed to the medial aspect of the left heel where an approximate 5 cm linear incision was made coursing proximal to distal. The incision was deepened via sharp and blunt dissection down to the level of the plantar fascia which was freed from the inferior soft tissues. Periosteal incision made along the medial aspect of the heel and the medial portion of the plantar fascia was carefully dissected away from the heel revealing an inferior spur. Using a Coker clamp the fascia was grasped and an approximate 1.5 cm section was removed and sent for pathology. At this point the bone spur was identified and resected using a rongeur and then rasped smoothed with a power rasp. Intraoperative FluoroScan views revealed good resection of the spur. The wound was flushed with copious amounts of sterile saline and closed using 2-0 Vicryl simple interrupted sutures for deep closure,  3-0 Vicryl simple interrupted sutures for superficial closure followed by skin closure using 4-0 nylon simple interrupted sutures. 2-0 nylon vertical mattress deep retention sutures were then placed to relieve tension off of the area. 17 cc of 0.5% Marcaine plain was then injected into the heel area postoperatively for analgesia. Xeroform 4 x 4's ABD and Kerlix applied to the left lower extremity. Tourniquet was released and blood flow noted to return immediately to the left foot and all digits. Fiberglass posterior splint then applied to the left lower extremity with the foot 90 degrees relative to the leg. Patient was awakened and transported to the PACU having tolerated the procedure and anesthesia well.

## 2019-10-11 NOTE — Anesthesia Postprocedure Evaluation (Signed)
Anesthesia Post Note  Patient: Catherine Lewis  Procedure(s) Performed: PARTIAL PLANTAR FASCIECTOMY WITH HEEL RESECTION OF LEFT FOOT (Left )  Patient location during evaluation: PACU Anesthesia Type: General Level of consciousness: awake and alert Pain management: pain level controlled Vital Signs Assessment: post-procedure vital signs reviewed and stable Respiratory status: spontaneous breathing, nonlabored ventilation and respiratory function stable Cardiovascular status: blood pressure returned to baseline and stable Postop Assessment: no apparent nausea or vomiting Anesthetic complications: no   No complications documented.   Last Vitals:  Vitals:   10/08/19 1040 10/08/19 1052  BP: (!) 105/50 136/85  Pulse: 75 76  Resp: 16 16  Temp: (!) 36.1 C   SpO2: 100% 100%    Last Pain:  Vitals:   10/08/19 1052  TempSrc:   PainSc: 2                  Karleen Hampshire

## 2019-10-12 LAB — SURGICAL PATHOLOGY

## 2019-12-30 ENCOUNTER — Other Ambulatory Visit: Payer: Self-pay | Admitting: Podiatry

## 2020-01-04 ENCOUNTER — Inpatient Hospital Stay: Admission: RE | Admit: 2020-01-04 | Payer: 59 | Source: Ambulatory Visit

## 2020-01-04 NOTE — Patient Instructions (Addendum)
Your procedure is scheduled on: 01/07/20 - Friday Report to the Registration Desk on the 1st floor [facility] To find out your arrival time, please call [phone] between 1PM - 3PM on: 01/06/20- Thursday  REMEMBER: Instructions that are not followed completely may result in serious medical risk, up to and including death; or upon the discretion of your surgeon and anesthesiologist your surgery may need to be rescheduled.  Do not eat food after midnight the night before surgery.  No gum chewing, lozengers or hard candies.  You may however, drink CLEAR liquids up to 2 hours before you are scheduled to arrive for your surgery. Do not drink anything within 2 hours of your scheduled arrival time.  Clear liquids include: - water  - apple juice without pulp - gatorade (not RED, PURPLE, OR BLUE) - black coffee or tea (Do NOT add milk or creamers to the coffee or tea) Do NOT drink anything that is not on this list.  Type 1 and Type 2 diabetics should only drink water.  In addition, your doctor has ordered for you to drink the provided  Ensure Pre-Surgery Clear Carbohydrate Drink  Drinking this carbohydrate drink up to two hours before surgery helps to reduce insulin resistance and improve patient outcomes. Please complete drinking 2 hours prior to scheduled arrival time.  TAKE THESE MEDICATIONS THE MORNING OF SURGERY WITH A SIP OF WATER: - phentermine 37.5 MG capsule .  One week prior to surgery: stop  01/04/20 Stop Anti-inflammatories (NSAIDS) such as Advil, Aleve, Ibuprofen, Motrin, Naproxen, Naprosyn and Aspirin based products such as Excedrin, Goodys Powder, BC Powder.  Stop ANY OVER THE COUNTER supplements until after surgery. (However, you may continue taking Vitamin D, Vitamin B, and multivitamin up until the day before surgery.)  No Alcohol for 24 hours before or after surgery.  No Smoking including e-cigarettes for 24 hours prior to surgery.  No chewable tobacco  products for at least 6 hours prior to surgery.  No nicotine patches on the day of surgery.  Do not use any "recreational" drugs for at least a week prior to your surgery.  Please be advised that the combination of cocaine and anesthesia may have negative outcomes, up to and including death. If you test positive for cocaine, your surgery will be cancelled.  On the morning of surgery brush your teeth with toothpaste and water, you may rinse your mouth with mouthwash if you wish. Do not swallow any toothpaste or mouthwash.  Do not wear jewelry, make-up, hairpins, clips or nail polish.  Do not wear lotions, powders, or perfumes.   Do not shave body from the neck down 48 hours prior to surgery just in case you cut yourself which could leave a site for infection.  Also, freshly shaved skin may become irritated if using the CHG soap.  Contact lenses, hearing aids and dentures may not be worn into surgery.  Do not bring valuables to the hospital. [facility] is not responsible for any missing/lost belongings or valuables.   Notify your doctor if there is any change in your medical condition (cold, fever, infection).  Wear comfortable clothing (specific to your surgery type) to the hospital.  Plan for stool softeners for home use; pain medications have a tendency to cause constipation. You can also help prevent constipation by eating foods high in fiber such as fruits and vegetables and drinking plenty of fluids as your diet allows.  After surgery, you can help prevent lung complications by  doing breathing exercises.  Take deep breaths and cough every 1-2 hours. Your doctor may order a device called an Incentive Spirometer to help you take deep breaths. When coughing or sneezing, hold a pillow firmly against your incision with both hands. This is called "splinting." Doing this helps protect your incision. It also decreases belly discomfort.  If you are being admitted to the hospital  overnight, leave your suitcase in the car. After surgery it may be brought to your room.  If you are being discharged the day of surgery, you will not be allowed to drive home. You will need a responsible adult (18 years or older) to drive you home and stay with you that night.   If you are taking public transportation, you will need to have a responsible adult (18 years or older) with you. Please confirm with your physician that it is acceptable to use public transportation.   Please call the [facility] Dept. at [phone] if you have any questions about these instructions.  Visitation Policy:  Patients undergoing a surgery or procedure may have one family member or support person with them as long as that person is not COVID-19 positive or experiencing its symptoms.  That person may remain in the waiting area during the procedure.  Inpatient Visitation Update:   In an effort to ensure the safety of our team members and our patients, we are implementing a change to our visitation policy:  Effective Monday, Aug. 9, at 7 a.m., inpatients will be allowed one support person.  o The support person may change daily.  o The support person must pass our screening, gel in and out, and wear a mask at all times, including in the patient's room.  o Patients must also wear a mask when staff or their support person are in the room.  o Masking is required regardless of vaccination status.  Systemwide, no visitors 17 or younger.

## 2020-01-05 ENCOUNTER — Other Ambulatory Visit: Payer: 59

## 2020-01-07 ENCOUNTER — Ambulatory Visit: Admission: RE | Admit: 2020-01-07 | Payer: 59 | Source: Home / Self Care | Admitting: Podiatry

## 2020-01-07 ENCOUNTER — Encounter: Admission: RE | Payer: Self-pay | Source: Home / Self Care

## 2020-01-07 SURGERY — FASCIECTOMY, PALM
Anesthesia: Choice | Laterality: Right

## 2020-02-01 ENCOUNTER — Other Ambulatory Visit: Payer: Self-pay

## 2020-02-01 DIAGNOSIS — Z6841 Body Mass Index (BMI) 40.0 and over, adult: Secondary | ICD-10-CM

## 2020-02-01 DIAGNOSIS — O30001 Twin pregnancy, unspecified number of placenta and unspecified number of amniotic sacs, first trimester: Secondary | ICD-10-CM

## 2020-02-03 ENCOUNTER — Ambulatory Visit: Payer: 59

## 2020-02-09 ENCOUNTER — Other Ambulatory Visit: Payer: Self-pay

## 2020-02-09 DIAGNOSIS — O30001 Twin pregnancy, unspecified number of placenta and unspecified number of amniotic sacs, first trimester: Secondary | ICD-10-CM

## 2020-02-09 DIAGNOSIS — Z6841 Body Mass Index (BMI) 40.0 and over, adult: Secondary | ICD-10-CM

## 2020-02-10 ENCOUNTER — Other Ambulatory Visit: Payer: Self-pay

## 2020-02-10 ENCOUNTER — Ambulatory Visit: Payer: 59 | Attending: Obstetrics

## 2020-02-10 ENCOUNTER — Ambulatory Visit: Payer: 59

## 2020-02-10 DIAGNOSIS — O30049 Twin pregnancy, dichorionic/diamniotic, unspecified trimester: Secondary | ICD-10-CM | POA: Diagnosis not present

## 2020-02-10 DIAGNOSIS — O30041 Twin pregnancy, dichorionic/diamniotic, first trimester: Secondary | ICD-10-CM | POA: Diagnosis not present

## 2020-02-10 DIAGNOSIS — O30001 Twin pregnancy, unspecified number of placenta and unspecified number of amniotic sacs, first trimester: Secondary | ICD-10-CM

## 2020-02-10 DIAGNOSIS — Z3A09 9 weeks gestation of pregnancy: Secondary | ICD-10-CM | POA: Diagnosis not present

## 2020-02-10 DIAGNOSIS — O99211 Obesity complicating pregnancy, first trimester: Secondary | ICD-10-CM | POA: Insufficient documentation

## 2020-02-10 DIAGNOSIS — Z6841 Body Mass Index (BMI) 40.0 and over, adult: Secondary | ICD-10-CM

## 2020-02-10 DIAGNOSIS — O09521 Supervision of elderly multigravida, first trimester: Secondary | ICD-10-CM | POA: Diagnosis not present

## 2020-02-10 DIAGNOSIS — E669 Obesity, unspecified: Secondary | ICD-10-CM

## 2020-02-10 NOTE — Progress Notes (Unsigned)
Catherine Lewis was seen in consultation due to a spontaneously conceived twin pregnancy.  Her pregnancy has also been complicated by maternal obesity and advanced maternal age.  She reports a history of plantar fasciitis and bone spurs in both of her feet.  She had surgery on one foot a few months ago for treatment of plantar fasciitis and bone spurs.  She reports that she will need another surgery on the other foot soon.  She denies any other significant past medical history and denies any problems in her current pregnancy.     She reports a history of a prior cesarean delivery about 15 years ago due to fetal macrosomia and arrest of dilatation.   On today's ultrasound exam, a thick dividing membrane was noted separating the two fetuses, indicating that these are dichorionic, diamniotic twins.   The crown-rump lengths measured for both twin A and twin B are consistent with her gestational age.   Fetal heartbeats were noted in both fetuses.   The management of dichorionic twins was discussed.  She was advised that management of twin pregnancies will involve frequent ultrasound exams to assess the fetal growths and amniotic fluid levels.    I have scheduled a first trimester ultrasound for her in 3 weeks to remeasure the crown-rump lengths.  We will also have her meet with the genetic counselor in 3 weeks.  Due to the twin gestation and advanced maternal age, I would recommend that she have a Panorama cell free DNA test run through the lab [facility].  The Panorama cell free DNA test will provide the zygosity of this twin gestation and will be able to determine the genders of each fetus.  These results are not available for twins through the MaterniT21 cell free DNA test run by [facility].   A fetal anatomy scan will be scheduled for her at around 19 weeks.  We will then continue to follow her with monthly growth ultrasounds.  Due to the dichorionic twin gestation, weekly fetal testing should be started at  around 36 weeks.    Delivery for uncomplicated dichorionic twins is recommended at around 38 weeks.  The patient reports that she will most likely have a repeat cesarean delivery scheduled.   The increased risk of preeclampsia, gestational diabetes, and preterm birth/labor associated with twin pregnancies was discussed.  She was advised that she will continue to be followed closely to assess for these conditions. As pregnancies with multiple gestations are at increased risk for developing preeclampsia, she was advised to continue taking up to 2 tablets of baby aspirin (81 mg per day) daily to decrease her risk of developing preeclampsia.   As the patient reports that she will most likely require another surgery for treatment of her plantar fasciitis and bone spurs, she was advised that the most optimal time for surgery requiring general anesthesia is probably early in the second trimester (at between 14 to 16 weeks).  She will most likely have her surgery scheduled at this time.   We look forward to continuing following her with you throughout her pregnancy.     At the end of the consultation, the patient stated that all of her questions have been answered to her complete satisfaction.     Thank you for referring this patient for a Maternal-Fetal Medicine consultation.     Total time spent in consultation: 35 minutes   Recommendations:   Continue taking 1 to 2 tablets of baby aspirin for preeclampsia prophylaxis Repeat ultrasound in 3 weeks  Panorama cell free DNA test in 3 weeks to determine zygosity Detailed fetal anatomy scan at around 19 weeks Monthly growth ultrasounds Weekly nonstress tests starting at 36 weeks Delivery at around 38 weeks

## 2020-02-23 LAB — OB RESULTS CONSOLE GC/CHLAMYDIA
Chlamydia: NEGATIVE
Gonorrhea: NEGATIVE

## 2020-02-29 ENCOUNTER — Other Ambulatory Visit: Payer: Self-pay

## 2020-02-29 DIAGNOSIS — O30009 Twin pregnancy, unspecified number of placenta and unspecified number of amniotic sacs, unspecified trimester: Secondary | ICD-10-CM

## 2020-02-29 LAB — OB RESULTS CONSOLE HIV ANTIBODY (ROUTINE TESTING): HIV: NONREACTIVE

## 2020-02-29 LAB — OB RESULTS CONSOLE ANTIBODY SCREEN: Antibody Screen: NEGATIVE

## 2020-02-29 LAB — OB RESULTS CONSOLE VARICELLA ZOSTER ANTIBODY, IGG: Varicella: IMMUNE

## 2020-02-29 LAB — OB RESULTS CONSOLE RUBELLA ANTIBODY, IGM: Rubella: IMMUNE

## 2020-02-29 LAB — OB RESULTS CONSOLE RPR: RPR: NONREACTIVE

## 2020-02-29 LAB — OB RESULTS CONSOLE HEPATITIS B SURFACE ANTIGEN: Hepatitis B Surface Ag: NEGATIVE

## 2020-03-02 ENCOUNTER — Ambulatory Visit: Payer: 59

## 2020-03-02 ENCOUNTER — Other Ambulatory Visit: Payer: Self-pay

## 2020-03-02 ENCOUNTER — Ambulatory Visit: Payer: 59 | Attending: Obstetrics and Gynecology

## 2020-03-02 DIAGNOSIS — O30041 Twin pregnancy, dichorionic/diamniotic, first trimester: Secondary | ICD-10-CM | POA: Insufficient documentation

## 2020-03-02 DIAGNOSIS — Z3A12 12 weeks gestation of pregnancy: Secondary | ICD-10-CM

## 2020-03-02 DIAGNOSIS — O30009 Twin pregnancy, unspecified number of placenta and unspecified number of amniotic sacs, unspecified trimester: Secondary | ICD-10-CM

## 2020-03-02 DIAGNOSIS — Z36 Encounter for antenatal screening for chromosomal anomalies: Secondary | ICD-10-CM

## 2020-03-02 DIAGNOSIS — O09521 Supervision of elderly multigravida, first trimester: Secondary | ICD-10-CM

## 2020-03-02 NOTE — Progress Notes (Signed)
Referring Provider:  Ward, Elenora Fender Length of Consultation: 40 minutes  Catherine Lewis was referred to [facility] at [facility] for genetic counseling because of maternal age.  The patient will be 35 years old at the time of delivery. She is pregnant with twins.  This note summarizes the information we discussed.    We explained that the chance of a chromosome abnormality increases with maternal age.  Chromosomes and examples of chromosome problems were reviewed.  Humans typically have 46 chromosomes in each cell, with half passed through each sperm and egg.  Any change in the number or structure of chromosomes can increase the risk of problems in the physical and mental development of a pregnancy.   Based upon age of the patient, the chance of any chromosome abnormality for each baby is 1 in 66.  The chance of Down syndrome, the most common chromosome problem associated with maternal age, was 1 in 74.  The risk of chromosome problems is in addition to the 3% general population risk for birth defects and mental retardation.  The greatest chance, of course, is that the baby would be born in good health.  We discussed the following prenatal screening and testing options for this pregnancy:  Cell free fetal DNA testing from maternal blood may be used to determine whether or not the baby may have either Down syndrome, trisomy 58, or trisomy 17.  This test utilizes a maternal blood sample and DNA sequencing technology to isolate circulating cell free fetal DNA from maternal plasma.  The fetal DNA can then be analyzed for DNA sequences that are derived from the three most common chromosomes involved in aneuploidy, chromosomes 13, 18, and 21.  If the overall amount of DNA is greater than the expected level for any of these chromosomes, aneuploidy is suspected.  While we do not consider it a replacement for invasive testing and karyotype analysis, a negative result from this testing would be  reassuring, though not a guarantee of a normal chromosome complement for the baby.  An abnormal result is certainly suggestive of an abnormal chromosome complement, though we would still recommend CVS or amniocentesis to confirm any findings from this testing.  This type of testing through [place] (Panorama testing) can also determine zygosity and gender in twin gestations.  This testing is not available in higher order multiple gestations.  First trimester screening, which can include nuchal translucency ultrasound screen and/or first trimester maternal serum marker screening.  The nuchal translucency has approximately an 70% detection rate for Down syndrome and can be positive for other chromosome abnormalities as well as heart defects.  When combined with a maternal serum marker screening in a twin gestation, the detection rate is up to 80% for Down syndrome and up to 90% for trisomy 18 and 13.     The chorionic villus sampling procedure is available for first trimester chromosome analysis.  This involves the withdrawal of a small amount of chorionic villi (tissue from the developing placenta).  Risk of pregnancy loss is estimated to be approximately 1 in 100 to 1 in 50 (1% to 2%).  There is approximately a 1% (1 in 100) chance that the CVS chromosome results will be unclear.  Chorionic villi cannot be tested for neural tube defects.     Maternal serum marker screening, a blood test that measures pregnancy proteins, can provide risk assessments for Down syndrome and open neural tube defects (spina bifida, anencephaly). Because it does not directly examine the  fetus, it cannot positively diagnose or rule out these problems.  Targeted ultrasound uses high frequency sound waves to create an image of the developing fetus.  An ultrasound is often recommended as a routine means of evaluating the pregnancy.  It is also used to screen for fetal anatomy problems (for example, a heart defect) that might be  suggestive of a chromosomal or other abnormality.   Amniocentesis involves the removal of a small amount of amniotic fluid from the sac surrounding the fetus with the use of a thin needle inserted through the maternal abdomen and uterus.  Ultrasound guidance is used throughout the procedure.  Fetal cells from amniotic fluid are directly evaluated and > 99.5% of chromosome problems and > 98% of open neural tube defects can be detected. This procedure is generally performed after the 15th week of pregnancy.  The main risks to this procedure include complications leading to miscarriage is up to 1 in 100 (1%) in a twin gestation.  Cystic Fibrosis and Spinal Muscular Atrophy (SMA) screening were also discussed with the patient. Both conditions are recessive, which means that both parents must be carriers in order to have a child with the disease.  Cystic fibrosis (CF) is one of the most common genetic conditions in persons of Caucasian ancestry.  This condition occurs in approximately 1 in 2,500 Caucasian persons and results in thickened secretions in the lungs, digestive, and reproductive systems.  For a baby to be at risk for having CF, both of the parents must be carriers for this condition.  Approximately 1 in 55 Caucasian persons is a carrier for CF.  Current carrier testing looks for the most common mutations in the gene for CF and can detect approximately 90% of carriers in the Caucasian population.  This means that the carrier screening can greatly reduce, but cannot eliminate, the chance for an individual to have a child with CF.  If an individual is found to be a carrier for CF, then carrier testing would be available for the partner. As part of [place]'s newborn screening profile, all babies born in the state of [place] will have a two-tier screening process.  Specimens are first tested to determine the concentration of immunoreactive trypsinogen (IRT).  The top 5% of specimens with the  highest IRT values then undergo DNA testing using a panel of over 40 common CF mutations. SMA is a neurodegenerative disorder that leads to atrophy of skeletal muscle and overall weakness.  This condition is also more prevalent in the Caucasian population, with 1 in 40-1 in 60 persons being a carrier and 1 in 6,000-1 in 10,000 children being affected.  There are multiple forms of the disease, with some causing death in infancy to other forms with survival into adulthood.  The genetics of SMA is complex, but carrier screening can detect up to 95% of carriers in the Caucasian population.  Similar to CF, a negative result can greatly reduce, but cannot eliminate, the chance to have a child with SMA. Hemoglobinopathy screening was also made available.  We obtained a detailed family history and pregnancy history.  The family history is unremarkable for birth defects, developmental delays, recurrent pregnancy loss or known chromosome abnormalities.   Catherine Lewis denied any other pregnancy complications thus far, as well as any drug, alcohol or tobacco use in this pregnancy. She is taking baby aspirin and folic acid supplementation.  After consideration of the options, the patient elected to proceed cell free fetal DNA testing to  be sent to [facility] for Panorama NIPS as well as carrier screening for CF, SMA and hemoglobinopathies which was sent to [facility] due to the fact that her partner is a [facility] Employee Catherine Lewis [facility] [id number]).  An ultrasound was performed at the time of the visit.  The gestational age was consistent with 12 weeks.  Fetal anatomy could not be assessed due to early gestational age.   Plan of care:  Panorama drawn today sent to [facility] by [employer].  Carrier screening drawn today sent to [facility] via direct pickup.  Return for fetal anatomy ultrasound at [redacted] weeks gestation.  Thank you for involving [place] in the care of this patient.  Catherine Lewis was encouraged to call with questions or  concerns.  We can be contacted at [phone].  Cherly Anderson, MS, CGC

## 2020-03-09 ENCOUNTER — Other Ambulatory Visit: Payer: 59

## 2020-03-14 ENCOUNTER — Telehealth: Payer: Self-pay | Admitting: Obstetrics and Gynecology

## 2020-03-14 ENCOUNTER — Encounter
Admission: RE | Admit: 2020-03-14 | Discharge: 2020-03-14 | Disposition: A | Discharge status: Home / Self Care | Payer: 59 | Source: Ambulatory Visit | Attending: Anesthesiology | Admitting: Anesthesiology

## 2020-03-14 ENCOUNTER — Other Ambulatory Visit: Payer: Self-pay

## 2020-03-14 NOTE — Telephone Encounter (Signed)
Ms. Boback was in the building and stopped by to inquired about her NIPS results.  I was just before calling her to review the results, so we spoke in person regarding the results.    The testing for Panorama NIPS through [facility] failed due to insufficient fetal fraction. NIPS analyzes cell free fetal DNA found in the maternal blood circulation during pregnancy. This test is not diagnostic for chromosome conditions, but can provide information regarding the presence or absence of extra fetal DNA for chromosomes 13, 18 and 21. The reported detection rate is greater than 99% for trisomy 21, greater than 98% for trisomy 18, and greater than 99% for trisomy 34. The false positive rate is reported to be less than 0.1% for any of these conditions. The term "fetal fraction" refers to the amount of sample that is believed to have come from fetal DNA rather than maternal DNA. A fetal fraction of at least 3-4% is needed for interpretation of the sample.  For Ms. Lope, the fetal fractions for this twin gestation were 1.8% and 1.6%.  The differentiation of two different fetal sequences in the sample confirms that the pregnancy is dizygotic.   We reviewed that there are many possible reasons a sample may have a low fetal fraction, including early gestational age, high maternal BMI, suboptimal sample collection, maternal use of medications like low molecular weight heparin, pregnancy loss, pregnancy complications, and normal variation. Low fetal fraction is also associated with chromosomal aneuploidy in 20-30% of cases that fail two times due to insufficient fetal fraction. We discussed that redrawing a new sample for NIPS was possible. Per Avelina Laine, a redraw would have a 41% success rate based on the patient's weight (>300 lbs). If the redraw was successful and contained a sufficient fetal fraction, results could clarify the risks for chromosomal aneuploidy in the current pregnancy.   Ms. Flenniken declined the option of  undergoing a redraw for NIPS at this time.  She stated that she may reconsider this option at the time of her anatomy ultrasound at [redacted] weeks gestation.  If she desires earlier redraw, we may be reached at [phone].  Cherly Anderson, MS, CGC

## 2020-03-14 NOTE — Consult Note (Signed)
[facility] Anesthesia Consultation  Catherine Lewis IZT:[id number] DOB: [birth year] DOA: 03/14/2020 PCP: Catherine Brazil, MD   Requesting physician: Dr. Elesa Massed  Date of consultation: 03/14/20 Reason for consultation: Obesity during pregnancy  CHIEF COMPLAINT:  Obesity during pregnancy  HISTORY OF PRESENT ILLNESS: Catherine Lewis  is a 35 y.o. female with a known history of obesity in pregnancy and gestational DM.  She is [redacted]w[redacted]d pregnant with twin gestation.  She has had one prior delivery which was a scheduled elective C/S due to LGA and post dates.    PAST MEDICAL HISTORY:   Past Medical History:  Diagnosis Date  . History of hiatal hernia     PAST SURGICAL HISTORY:  Past Surgical History:  Procedure Laterality Date  . CESAREAN SECTION  2007  . FASCIECTOMY Left 10/08/2019   Procedure: PARTIAL PLANTAR FASCIECTOMY WITH HEEL RESECTION OF LEFT FOOT;  Surgeon: Linus Galas, DPM;  Location: [facility] ORS;  Service: Podiatry;  Laterality: Left;  . LAPAROSCOPIC GASTRIC BANDING WITH HIATAL HERNIA REPAIR N/A 08/06/2016   Procedure: LAPAROSCOPIC GASTRIC BANDING WITH HIATAL HERNIA REPAIR;  Surgeon: Gaynelle Adu, MD;  Location: [facility] ORS;  Service: General;  Laterality: N/A;    SOCIAL HISTORY:  Social History   Tobacco Use  . Smoking status: Former Smoker    Types: Cigarettes    Quit date: 12/04/2004    Years since quitting: 15.2  . Smokeless tobacco: Never Used  Substance Use Topics  . Alcohol use: Not Currently    FAMILY HISTORY:  Family History  Problem Relation Age of Onset  . Melanoma Father   . Diabetes Paternal Grandmother     DRUG ALLERGIES: No Known Allergies  REVIEW OF SYSTEMS:   RESPIRATORY: No cough, shortness of breath, wheezing.  CARDIOVASCULAR: No chest pain, orthopnea, edema.  HEMATOLOGY: No anemia, easy bruising or bleeding SKIN: No rash or lesion. NEUROLOGIC: No tingling, numbness, weakness.  PSYCHIATRY: No anxiety or depression.    MEDICATIONS AT HOME:  Prior to Admission medications   Medication Sig Start Date End Date Taking? Authorizing Provider  aspirin EC 81 MG tablet Take 81 mg by mouth daily. Swallow whole.    [provider]  folic acid (FOLVITE) 1 MG tablet Take 1 mg by mouth daily.    [provider]  polycarbophil (FIBERCON) 625 MG tablet Take 625 mg by mouth daily. Patient not taking: Reported on 02/10/2020    [provider]  Prenatal Vit-Fe Fumarate-FA (MULTIVITAMIN-PRENATAL) 27-0.8 MG TABS tablet Take 1 tablet by mouth daily at 12 noon.    [provider]      PHYSICAL EXAMINATION:   VITAL SIGNS: Last menstrual period 12/09/2019.  GENERAL:  35 y.o.-year-old patient no acute distress.  HEENT: Head atraumatic, normocephalic. Oropharynx and nasopharynx clear. MP 1, TM distance >3 cm, normal mouth opening. LUNGS: No use of accessory muscles of respiration.   EXTREMITIES: No pedal edema, cyanosis, or clubbing.  NEUROLOGIC: normal gait PSYCHIATRIC: The patient is alert and oriented x 3.  SKIN: No obvious rash, lesion, or ulcer.    IMPRESSION AND PLAN:   Catherine Lewis  is a 35 y.o. female presenting with obesity during pregnancy. BMI is currently 50.1 at [redacted]w[redacted]d gestation.   Airway exam is reassuring today.  Spinal interspaces are minimally palpable.  We discussed analgesic options during labor including epidural analgesia. Discussed that in obesity there can be increased difficulty with epidural placement or even failure of successful epidural. We also discussed that even after successful epidural placement  there is increased risk of catheter migration out of the epidural space that would require catheter replacement. Discussed use of epidural vs spinal vs GA if cesarean delivery is required. Discussed increased risk of difficult intubation during pregnancy should an emergency cesarean delivery be required.   We discussed repeat evaluation at 35-36 weeks by  anesthesia to determine whether there is a high risk of complications of anesthesia for which we would recommend transfer of OB care to a facility with a higher maternal level of care designation.    Karleen Hampshire, MD

## 2020-03-21 ENCOUNTER — Other Ambulatory Visit: Payer: Self-pay

## 2020-03-23 ENCOUNTER — Other Ambulatory Visit: Payer: Self-pay

## 2020-03-23 ENCOUNTER — Encounter: Payer: 59 | Admitting: *Deleted

## 2020-03-23 ENCOUNTER — Encounter: Payer: Self-pay | Admitting: *Deleted

## 2020-03-23 VITALS — BP 120/74 | Ht 66.0 in | Wt 312.0 lb

## 2020-03-23 DIAGNOSIS — Z3A00 Weeks of gestation of pregnancy not specified: Secondary | ICD-10-CM | POA: Diagnosis not present

## 2020-03-23 DIAGNOSIS — O24410 Gestational diabetes mellitus in pregnancy, diet controlled: Secondary | ICD-10-CM

## 2020-03-23 DIAGNOSIS — O24419 Gestational diabetes mellitus in pregnancy, unspecified control: Secondary | ICD-10-CM | POA: Insufficient documentation

## 2020-03-23 NOTE — Patient Instructions (Signed)
Read booklet on Gestational Diabetes Follow Gestational Meal Planning Guidelines Limit desserts/sweets Avoid fruit at breakfast if blood sugars elevated Check blood sugars 4 x day - before breakfast and 2 hrs after every meal and record  Bring blood sugar log to all appointments Call MD for prescription for meter strips and lancets Strips One Touch Verio  Lancets   One Touch Delica Plus Purchase urine ketone strips if instructed by MD and check urine ketones every am:  If + increase bedtime snack to 1 protein and 2 carbohydrate servings Walk 20-30 minutes at least 5 x week if permitted by MD

## 2020-03-23 NOTE — Progress Notes (Signed)
Diabetes Self-Management Education  Visit Type: First/Initial  Appt. Start Time: 1515 Appt. End Time: 5993  03/23/2020  [name], identified by name and date of birth, is a 35 y.o. female with a diagnosis of Diabetes: Gestational Diabetes.   ASSESSMENT  Blood pressure 120/74, height _0  (1.676 m), weight (!) 312 lb (141.5 kg), last menstrual period 12/09/2019, estimated date of delivery 09/14/2020 Body mass index is 50.36 kg/m.   Diabetes Self-Management Education - 03/23/20 1620      Visit Information   Visit Type First/Initial      Initial Visit   Diabetes Type Gestational Diabetes    Are you currently following a meal plan? Yes    What type of meal plan do you follow? "watching sugar intake"    Are you taking your medications as prescribed? Yes    Date Diagnosed 2 weeks ago      Health Coping   How would you rate your overall health? Good;Fair      Psychosocial Assessment   Patient Belief/Attitude about Diabetes Motivated to manage diabetes    Self-care barriers None    Self-management support Doctor's office;Family    Other persons present Spouse/SO    Patient Concerns Nutrition/Meal planning;Glycemic Control;Monitoring    Special Needs None    Preferred Learning Style Auditory;Visual;Hands on    [place] in progress    How often do you need to have someone help you when you read instructions, pamphlets, or other written materials from your doctor or pharmacy? 1 - Never    What is the last grade level you completed in school? Bachelors      Pre-Education Assessment   Patient understands the diabetes disease and treatment process. Needs Instruction    Patient understands incorporating nutritional management into lifestyle. Needs Instruction    Patient undertands incorporating physical activity into lifestyle. Needs Instruction    Patient understands using medications safely. Needs Instruction    Patient understands monitoring blood glucose,  interpreting and using results Needs Instruction    Patient understands prevention, detection, and treatment of acute complications. Needs Instruction    Patient understands prevention, detection, and treatment of chronic complications. Needs Instruction    Patient understands how to develop strategies to address psychosocial issues. Needs Instruction    Patient understands how to develop strategies to promote health/change behavior. Needs Instruction      Complications   Last HgB A1C per patient/outside source 5.3 %   02/29/2020   How often do you check your blood sugar? 0 times/day (not testing)   Provided One Touch Verio Reflect meter and instructe on use. BG upon return demonstration was 86 mg/dL at 4:45 pm - 2 hrs pp.   Have you had a dilated eye exam in the past 12 months? No    Have you had a dental exam in the past 12 months? Yes    Are you checking your feet? No      Dietary Intake   Breakfast skips if nauseated - tries sip of milk and then may eat; bacon, sausage, eggs; fruit (bananas, apples, oranges, tangerines)    Snack (morning) pretzels and peanut butter, nuts, chex mix, cheese thins, wheat thins    Lunch chicken, [place], beef, pork, ham, veggies - crrots, brussels sprouts, broccoli, soup    Snack (afternoon) same as morning    Dinner chicken, beef, pork; potatoes, corn, peas, beans, rice, pasta, cauliflower, green beans, salads - lettuce, tomatoes, carrots, cuccumbers    Beverage(s) water,  diet soda, sparkling water, [place]      Exercise   Exercise Type ADL's      Patient Education   Previous Diabetes Education No    Disease state  Definition of diabetes, type 1 and 2, and the diagnosis of diabetes;Factors that contribute to the development of diabetes    Nutrition management  Role of diet in the treatment of diabetes and the relationship between the three main macronutrients and blood glucose level;Food label reading, portion sizes and measuring food.;Reviewed blood glucose  goals for pre and post meals and how to evaluate the patients' food intake on their blood glucose level.    Physical activity and exercise  Role of exercise on diabetes management, blood pressure control and cardiac health.    Medications Other (comment)   Limited use of oral medications during pregnancy and potential for insulin.   Monitoring Taught/evaluated SMBG meter.;Purpose and frequency of SMBG.;Taught/discussed recording of test results and interpretation of SMBG.;Identified appropriate SMBG and/or A1C goals.;Ketone testing, when, how.    Chronic complications Relationship between chronic complications and blood glucose control    Psychosocial adjustment Identified and addressed patients feelings and concerns about diabetes    Preconception care Pregnancy and GDM  Role of pre-pregnancy blood glucose control on the development of the fetus;Reviewed with patient blood glucose goals with pregnancy;Role of family planning for patients with diabetes      Individualized Goals (developed by patient)   Reducing Risk Other (comment)   improve blood sugars, prevent diabetse complications     Outcomes   Expected Outcomes Demonstrated interest in learning. Expect positive outcomes    Future DMSE PRN    Program Status Not Completed           Individualized Plan for Diabetes Self-Management Training:   Learning Objective:  Patient will have a greater understanding of diabetes self-management. Patient education plan is to attend individual and/or group sessions per assessed needs and concerns.   Plan:   Patient Instructions  Read booklet on Gestational Diabetes Follow Gestational Meal Planning Guidelines Limit desserts/sweets Avoid fruit at breakfast if blood sugars elevated Check blood sugars 4 x day - before breakfast and 2 hrs after every meal and record  Bring blood sugar log to all appointments Call MD for prescription for meter strips and lancets Strips One Touch Verio  Lancets    One Touch Delica Plus Purchase urine ketone strips if instructed by MD and check urine ketones every am:  If + increase bedtime snack to 1 protein and 2 carbohydrate servings Walk 20-30 minutes at least 5 x week if permitted by MD  Expected Outcomes:  Demonstrated interest in learning. Expect positive outcomes  Education material provided:  Gestational Booklet Gestational Meal Planning Guidelines Simple Meal Plan Viewed Gestational Diabetes Video Meter = One Touch Verio Reflect Goals for a Healthy Pregnancy  If problems or questions, patient to contact team via:  Johny Drilling, RN, [place], [place] [phone]  Future DSME appointment: PRN  The patient has met with our dietitian multiple times in the past due to bariatric surgery and does not require a follow up visit at this time.

## 2020-03-28 ENCOUNTER — Other Ambulatory Visit: Payer: Self-pay

## 2020-03-28 ENCOUNTER — Telehealth: Payer: Self-pay | Admitting: Obstetrics and Gynecology

## 2020-03-28 NOTE — Telephone Encounter (Signed)
  We spoke with Catherine Lewis to review the results of the recent carrier screening. The patient elected to undergo carrier screening for the following conditions Cystic fibrosis (CF), spinal muscular atrophy (SMA) and hemoglobinopathies through the Inherit[employer] screening panel.  To review, CF is a genetic condition that occurs most often in Caucasian persons.  It primarily affects the lungs, digestive, and reproductive systems.  For someone to be at risk for having CF, both of their parents must be carriers for CF.  The testing can detect many persons who are carriers for CF and therefore determine if the pregnancy is at an increased risk for this condition.  The blood test results were negative when examined for the 97 most common mutations (or changes) in the gene for CF.  This means that she does not carry any of the most common changes in this gene.  Testing for these 97 mutations detects approximately 93% of carriers who are Caucasian.  Therefore, the chance that she is a carrier based on this negative result has been reduced from 1 in 25 to approximately 1 in 343.  Because this testing cannot detect all changes that may cause CF, we cannot eliminate the chance that this individual is a carrier completely.  SMA is also a recessive genetic condition with variable age of onset and severity caused by mutations in the SMN1 gene.  This carrier testing assesses the number of copies of this gene.  Persons with one copy of the SMN1 gene are carriers, and those with no copies are affected with the condition.  Individuals with two or more copies have a reduced chance to be a carrier.  Not all mutations can be detected with this testing, though it can detect 94.8% of carriers in the Caucasian population.  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 *3+80T>G SNP, thus reducing her chance to be a carrier from 1 in 47 to 1 in 921.  Again, this testing cannot eliminate the  chance to have a child with SMA, but dramatically reduces the chance.    Lastly, the hemoglobin fractionation cascade was also normal, showing AA hemoglobin.  This, combined with her MCV of 86 dramatically reduces the chance that she is a carrier for any hemoglobin variant.  We encouraged the patient to call with any questions or concerns as they arise. We may be reached at [phone].   Cherly Anderson, MS, CGC

## 2020-04-13 ENCOUNTER — Other Ambulatory Visit: Payer: Self-pay | Admitting: Obstetrics & Gynecology

## 2020-04-13 DIAGNOSIS — O30042 Twin pregnancy, dichorionic/diamniotic, second trimester: Secondary | ICD-10-CM

## 2020-04-13 DIAGNOSIS — O99212 Obesity complicating pregnancy, second trimester: Secondary | ICD-10-CM

## 2020-04-20 ENCOUNTER — Ambulatory Visit: Payer: 59 | Attending: Maternal & Fetal Medicine

## 2020-04-20 ENCOUNTER — Other Ambulatory Visit: Payer: Self-pay

## 2020-04-20 DIAGNOSIS — Z3A19 19 weeks gestation of pregnancy: Secondary | ICD-10-CM | POA: Diagnosis not present

## 2020-04-20 DIAGNOSIS — O09522 Supervision of elderly multigravida, second trimester: Secondary | ICD-10-CM | POA: Diagnosis not present

## 2020-04-20 DIAGNOSIS — E669 Obesity, unspecified: Secondary | ICD-10-CM | POA: Diagnosis not present

## 2020-04-20 DIAGNOSIS — O30042 Twin pregnancy, dichorionic/diamniotic, second trimester: Secondary | ICD-10-CM | POA: Insufficient documentation

## 2020-04-20 DIAGNOSIS — O99212 Obesity complicating pregnancy, second trimester: Secondary | ICD-10-CM

## 2020-04-27 ENCOUNTER — Other Ambulatory Visit: Payer: Self-pay

## 2020-04-27 ENCOUNTER — Encounter: Payer: Self-pay | Admitting: *Deleted

## 2020-04-27 ENCOUNTER — Encounter: Payer: 59 | Admitting: *Deleted

## 2020-04-27 VITALS — BP 108/72 | Ht 66.0 in | Wt 315.6 lb

## 2020-04-27 DIAGNOSIS — O24414 Gestational diabetes mellitus in pregnancy, insulin controlled: Secondary | ICD-10-CM | POA: Insufficient documentation

## 2020-04-27 NOTE — Progress Notes (Signed)
Diabetes Self-Management Education  Visit Type: Follow-up  Appt. Start Time: 1440 Appt. End Time: 1535  04/27/2020  [name], identified by name and date of birth, is a 35 y.o. female with a diagnosis of Diabetes: Gestational Diabetes.   ASSESSMENT  Blood pressure 108/72, height 5\' 6"  (1.676 m), weight (!) 315 lb 9.6 oz (143.2 kg), last menstrual period 12/09/2019, estimated date of delivery 09/14/2020 Body mass index is 50.94 kg/m.   Diabetes Self-Management Education - 04/27/20 1552      Visit Information   Visit Type Follow-up      Initial Visit   Diabetes Type Gestational Diabetes      Complications   How often do you check your blood sugar? 3-4 times/day    Fasting Blood glucose range (mg/dL) 04/29/20   40-086 mg/dL - 5/6 above guidelines   Postprandial Blood glucose range (mg/dL) 76-195   pp breakfast 80-127 mg/dL 1/5 above guidelines; pp lunch 81-135 mg/dL 1/3 above guidelines; pp supper 111-138 mg/dL 1/4 above guidelines     Dietary Intake   Breakfast 3 meals and 3 snacks/day      Exercise   Exercise Type ADL's      Patient Education   Nutrition management  Role of diet in the treatment of diabetes and the relationship between the three main macronutrients and blood glucose level;Reviewed blood glucose goals for pre and post meals and how to evaluate the patients' food intake on their blood glucose level.    Physical activity and exercise  Role of exercise on diabetes management, blood pressure control and cardiac health.    Medications Taught/reviewed insulin injection, site rotation, insulin storage and needle disposal.;Reviewed patients medication for diabetes, action, purpose, timing of dose and side effects.;Other (comment)   Pt injected 5 units NS to left abdomen subcutaneously without difficulty.   Monitoring Taught/discussed recording of test results and interpretation of SMBG.;Identified appropriate SMBG and/or A1C goals.    Acute complications  Taught treatment of hypoglycemia - the 15 rule.    Preconception care Reviewed with patient blood glucose goals with pregnancy      Outcomes   Expected Outcomes Demonstrated interest in learning. Expect positive outcomes      Subsequent Visit   Since your last visit have you continued or begun to take your medications as prescribed? Yes    Since your last visit have you had your blood pressure checked? Yes    Is your most recent blood pressure lower, unchanged, or higher since your last visit? Lower    Since your last visit have you experienced any weight changes? Gain    Weight Gain (lbs) 3    Since your last visit, are you checking your blood glucose at least once a day? Yes           Individualized Plan for Diabetes Self-Management Training:   Learning Objective:  Patient will have a greater understanding of diabetes self-management. Patient education plan is to attend individual and/or group sessions per assessed needs and concerns.   Plan:   Patient Instructions     Follow Gestational Meal Planning Guidelines Check blood sugars 4 x day - before breakfast and 2 hrs after every meal and record  Bring blood sugar log to MD appointments  Walk 20-30 minutes at least 5 x week if permitted by MD  Carry fast acting glucose and a snack at all times Rotate injection sites Give morning insulin 30 minutes before breakfast and evening insulin 30 minutes before supper  Morning insulin Humulin/Novolin    N  (cloudy)     30 units Humulin/Novolin    R  (clear)      12 units                                            42  units  Evening insulin Humulin/Novolin    N (cloudy)     11 units Humulin/Novolin    R (clear)      11 units                    22 units  Expected Outcomes:  Demonstrated interest in learning. Expect positive outcomes  Education material provided: Injection Guide (BD) Glucose tablets Symptoms, causes and treatments of Hypoglycemia Insulin syringes 1/2 ml 31 g  6 mm - 5 packs of 10 each  If problems or questions, patient to contact team via:  Sharion Settler, RN, CCM, CDCES [phone]  Future DSME appointment: PRN

## 2020-04-27 NOTE — Patient Instructions (Addendum)
  Follow Gestational Meal Planning Guidelines Check blood sugars 4 x day - before breakfast and 2 hrs after every meal and record  Bring blood sugar log to MD appointments  Walk 20-30 minutes at least 5 x week if permitted by MD  Carry fast acting glucose and a snack at all times Rotate injection sites Give morning insulin 30 minutes before breakfast and evening insulin 30 minutes before supper  Morning insulin Humulin/Novolin    N  (cloudy)     30 units Humulin/Novolin    R  (clear)      12 units                                            42  units  Evening insulin Humulin/Novolin    N (cloudy)     11 units Humulin/Novolin    R (clear)      11 units                    22 units

## 2020-04-28 ENCOUNTER — Telehealth: Payer: Self-pay | Admitting: *Deleted

## 2020-04-28 NOTE — Telephone Encounter (Signed)
Phone call to f/u with insulin administration. Pt reports she had to go to [facility] to get Regular [facility] since [facility]. She also was able to switch syringes since [facility] 30 units syringes and her dosage is more. She will start this evening. We reviewed symptoms and proper treatments for hypoglycemia. She reports that she got some more glucose tabs at the pharmacy. She reports the nurses at her work are worried about her insulin dosages. Instructed her to start with 1/2 dose for first few injections this week-end to make sure she is not having low blood sugars and then increase to MD ordered amount. Her post meal blood sugars were improved at yesterday's visit but her fastings were out of range. Instructed her to call back for any questions.

## 2020-05-07 ENCOUNTER — Other Ambulatory Visit: Payer: Self-pay | Admitting: Obstetrics & Gynecology

## 2020-05-07 DIAGNOSIS — O99212 Obesity complicating pregnancy, second trimester: Secondary | ICD-10-CM

## 2020-05-07 DIAGNOSIS — O09522 Supervision of elderly multigravida, second trimester: Secondary | ICD-10-CM

## 2020-05-07 DIAGNOSIS — O30042 Twin pregnancy, dichorionic/diamniotic, second trimester: Secondary | ICD-10-CM

## 2020-05-18 ENCOUNTER — Ambulatory Visit: Payer: 59 | Attending: Maternal & Fetal Medicine

## 2020-05-18 ENCOUNTER — Other Ambulatory Visit: Payer: Self-pay

## 2020-05-18 DIAGNOSIS — O99212 Obesity complicating pregnancy, second trimester: Secondary | ICD-10-CM

## 2020-05-18 DIAGNOSIS — O09522 Supervision of elderly multigravida, second trimester: Secondary | ICD-10-CM | POA: Diagnosis not present

## 2020-05-18 DIAGNOSIS — E669 Obesity, unspecified: Secondary | ICD-10-CM | POA: Diagnosis not present

## 2020-05-18 DIAGNOSIS — O30042 Twin pregnancy, dichorionic/diamniotic, second trimester: Secondary | ICD-10-CM

## 2020-05-18 DIAGNOSIS — O24414 Gestational diabetes mellitus in pregnancy, insulin controlled: Secondary | ICD-10-CM | POA: Insufficient documentation

## 2020-05-18 DIAGNOSIS — Z3A23 23 weeks gestation of pregnancy: Secondary | ICD-10-CM | POA: Diagnosis not present

## 2020-05-19 ENCOUNTER — Encounter: Payer: Self-pay | Admitting: Maternal & Fetal Medicine

## 2020-06-12 ENCOUNTER — Other Ambulatory Visit: Payer: Self-pay | Admitting: Obstetrics & Gynecology

## 2020-06-12 DIAGNOSIS — O99212 Obesity complicating pregnancy, second trimester: Secondary | ICD-10-CM

## 2020-06-12 DIAGNOSIS — O09522 Supervision of elderly multigravida, second trimester: Secondary | ICD-10-CM

## 2020-06-12 DIAGNOSIS — O24419 Gestational diabetes mellitus in pregnancy, unspecified control: Secondary | ICD-10-CM

## 2020-06-12 DIAGNOSIS — O30042 Twin pregnancy, dichorionic/diamniotic, second trimester: Secondary | ICD-10-CM

## 2020-06-15 ENCOUNTER — Ambulatory Visit: Payer: 59 | Attending: Obstetrics and Gynecology

## 2020-06-15 ENCOUNTER — Other Ambulatory Visit: Payer: Self-pay

## 2020-06-15 DIAGNOSIS — O24419 Gestational diabetes mellitus in pregnancy, unspecified control: Secondary | ICD-10-CM

## 2020-06-15 DIAGNOSIS — Z3A27 27 weeks gestation of pregnancy: Secondary | ICD-10-CM | POA: Insufficient documentation

## 2020-06-15 DIAGNOSIS — O24414 Gestational diabetes mellitus in pregnancy, insulin controlled: Secondary | ICD-10-CM | POA: Diagnosis not present

## 2020-06-15 DIAGNOSIS — O09522 Supervision of elderly multigravida, second trimester: Secondary | ICD-10-CM | POA: Diagnosis not present

## 2020-06-15 DIAGNOSIS — O99212 Obesity complicating pregnancy, second trimester: Secondary | ICD-10-CM | POA: Insufficient documentation

## 2020-06-15 DIAGNOSIS — O30042 Twin pregnancy, dichorionic/diamniotic, second trimester: Secondary | ICD-10-CM | POA: Diagnosis not present

## 2020-06-15 DIAGNOSIS — E669 Obesity, unspecified: Secondary | ICD-10-CM | POA: Insufficient documentation

## 2020-07-05 ENCOUNTER — Other Ambulatory Visit: Payer: Self-pay | Admitting: Obstetrics & Gynecology

## 2020-07-05 DIAGNOSIS — O09523 Supervision of elderly multigravida, third trimester: Secondary | ICD-10-CM

## 2020-07-05 DIAGNOSIS — O99213 Obesity complicating pregnancy, third trimester: Secondary | ICD-10-CM

## 2020-07-05 DIAGNOSIS — O30043 Twin pregnancy, dichorionic/diamniotic, third trimester: Secondary | ICD-10-CM

## 2020-07-05 DIAGNOSIS — O24414 Gestational diabetes mellitus in pregnancy, insulin controlled: Secondary | ICD-10-CM

## 2020-07-12 LAB — OB RESULTS CONSOLE GBS: GBS: POSITIVE

## 2020-07-18 ENCOUNTER — Other Ambulatory Visit: Payer: Self-pay

## 2020-07-18 ENCOUNTER — Ambulatory Visit: Payer: 59 | Attending: Maternal & Fetal Medicine

## 2020-07-18 DIAGNOSIS — O09523 Supervision of elderly multigravida, third trimester: Secondary | ICD-10-CM | POA: Insufficient documentation

## 2020-07-18 DIAGNOSIS — Z3A31 31 weeks gestation of pregnancy: Secondary | ICD-10-CM | POA: Insufficient documentation

## 2020-07-18 DIAGNOSIS — O24414 Gestational diabetes mellitus in pregnancy, insulin controlled: Secondary | ICD-10-CM | POA: Insufficient documentation

## 2020-07-18 DIAGNOSIS — O30043 Twin pregnancy, dichorionic/diamniotic, third trimester: Secondary | ICD-10-CM | POA: Insufficient documentation

## 2020-07-18 DIAGNOSIS — O99213 Obesity complicating pregnancy, third trimester: Secondary | ICD-10-CM | POA: Diagnosis not present

## 2020-07-18 DIAGNOSIS — Z794 Long term (current) use of insulin: Secondary | ICD-10-CM | POA: Diagnosis not present

## 2020-07-19 ENCOUNTER — Other Ambulatory Visit: Payer: Self-pay | Admitting: Obstetrics & Gynecology

## 2020-07-19 DIAGNOSIS — O30043 Twin pregnancy, dichorionic/diamniotic, third trimester: Secondary | ICD-10-CM

## 2020-07-19 DIAGNOSIS — O09523 Supervision of elderly multigravida, third trimester: Secondary | ICD-10-CM

## 2020-07-19 DIAGNOSIS — O99213 Obesity complicating pregnancy, third trimester: Secondary | ICD-10-CM

## 2020-07-19 DIAGNOSIS — O24414 Gestational diabetes mellitus in pregnancy, insulin controlled: Secondary | ICD-10-CM

## 2020-07-21 ENCOUNTER — Other Ambulatory Visit: Payer: Self-pay

## 2020-07-21 DIAGNOSIS — O30043 Twin pregnancy, dichorionic/diamniotic, third trimester: Secondary | ICD-10-CM

## 2020-07-23 ENCOUNTER — Observation Stay
Admission: EM | Admit: 2020-07-23 | Discharge: 2020-07-23 | Disposition: A | Discharge status: Home / Self Care | Payer: 59

## 2020-07-23 DIAGNOSIS — O0993 Supervision of high risk pregnancy, unspecified, third trimester: Secondary | ICD-10-CM | POA: Diagnosis not present

## 2020-07-23 DIAGNOSIS — Z369 Encounter for antenatal screening, unspecified: Secondary | ICD-10-CM | POA: Diagnosis not present

## 2020-07-23 DIAGNOSIS — O99820 Streptococcus B carrier state complicating pregnancy: Secondary | ICD-10-CM | POA: Diagnosis not present

## 2020-07-23 DIAGNOSIS — Z3A32 32 weeks gestation of pregnancy: Secondary | ICD-10-CM | POA: Diagnosis not present

## 2020-07-23 DIAGNOSIS — O99213 Obesity complicating pregnancy, third trimester: Secondary | ICD-10-CM | POA: Insufficient documentation

## 2020-07-23 DIAGNOSIS — O24414 Gestational diabetes mellitus in pregnancy, insulin controlled: Secondary | ICD-10-CM | POA: Diagnosis not present

## 2020-07-23 DIAGNOSIS — O30043 Twin pregnancy, dichorionic/diamniotic, third trimester: Secondary | ICD-10-CM | POA: Diagnosis not present

## 2020-07-23 DIAGNOSIS — E669 Obesity, unspecified: Secondary | ICD-10-CM | POA: Insufficient documentation

## 2020-07-23 NOTE — Discharge Summary (Signed)
Catherine Lewis is a 35 y.o. female. She is at [redacted]w[redacted]d gestation. Patient's last menstrual period was 12/09/2019. 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 no CTX, no VB.no LOF,  Active fetal movement.    Maternal Medical History:  Past Medical Hx:  has a past medical history of Gestational diabetes and History of hiatal hernia.  Past Surgical Hx:  has a past surgical history that includes Laparoscopic gastric banding with hiatal hernia repair (N/A, 08/06/2016); Cesarean section (2007); and Fasciectomy (Left, 10/08/2019).   No Known Allergies  Prior to Admission medications   Medication Sig Start Date End Date Taking? Authorizing Provider  aspirin EC 81 MG tablet Take 81 mg by mouth daily. Swallow whole.    [provider]  folic acid (FOLVITE) 1 MG tablet Take 1 mg by mouth daily.    [provider]  insulin regular (NOVOLIN R) 100 units/mL injection AM-12 & 30 PM-14-14 04/21/20   [provider]  NOVOLIN N 100 UNIT/ML injection Inject into the skin. 04/25/20   [provider]  Prenatal Vit-Fe Fumarate-FA (MULTIVITAMIN-PRENATAL) 27-0.8 MG TABS tablet Take 1 tablet by mouth daily at 12 noon.    [provider]     Social History: She  reports that she quit smoking about 15 years ago. Her smoking use included cigarettes. She has a 0.50 pack-year smoking history. She has never used smokeless tobacco. She reports previous alcohol use. She reports previous drug use.  Family History: family history includes Diabetes in her paternal grandmother; Melanoma in her father.  no history of gyn cancers  Review  of Systems: A full review of systems was performed and negative except as noted in the HPI.     O:  LMP 12/09/2019  No results found for this or any previous visit (from the past 48 hour(s)).   Constitutional: NAD, AAOx3  CV: RRR PULM: nl respiratory effort    Abd: gravid, non-tender, non-distended, soft      Ext: Non-tender, Nonedmeatous   Psych: mood appropriate, speech normal Pelvic: deferred   NST:  Baby A: Baseline: 140 Variability: moderate Accelerations present x >2 Decelerations absent  Baby B: Baseline: 145 Variability: moderate Accelerations present x >2 Decelerations absent  Toco: quiet   Time   A/P: 35 y.o. [redacted]w[redacted]d with high risk pregnancy and antepartum surveillance.  Fetal Wellbeing: Reassuring Cat 1 tracing. Reactive NST x 2  D/c home stable, precautions reviewed, follow-up as scheduled.   ----- Margaretmary Eddy, CNM Certified Nurse Midwife [place]  [facility]

## 2020-07-23 NOTE — OB Triage Note (Addendum)
Pt. reports to Labor & Delivery today for scheduled Non-Stress Test due to di-di twin pregnancy. No complaints or concerns at this time. Denies vaginal bleeding, LOF, regular contractions. States positive fetal movement. Initial FHR Twin A (Boy) 140bpm. Initial FHR Twin B (Girl) 140bpm. Margaretmary Eddy, CNM, aware of pt.'s arrival and will review tracing prior to discharge.  NST Reactive. Pt. discharged home.

## 2020-07-25 ENCOUNTER — Other Ambulatory Visit: Payer: Self-pay

## 2020-07-25 ENCOUNTER — Ambulatory Visit: Payer: 59 | Attending: Obstetrics and Gynecology

## 2020-07-25 DIAGNOSIS — O99213 Obesity complicating pregnancy, third trimester: Secondary | ICD-10-CM | POA: Insufficient documentation

## 2020-07-25 DIAGNOSIS — O30043 Twin pregnancy, dichorionic/diamniotic, third trimester: Secondary | ICD-10-CM | POA: Diagnosis not present

## 2020-07-25 DIAGNOSIS — O09523 Supervision of elderly multigravida, third trimester: Secondary | ICD-10-CM | POA: Diagnosis not present

## 2020-07-25 DIAGNOSIS — Z3A32 32 weeks gestation of pregnancy: Secondary | ICD-10-CM | POA: Diagnosis not present

## 2020-07-25 DIAGNOSIS — O24414 Gestational diabetes mellitus in pregnancy, insulin controlled: Secondary | ICD-10-CM | POA: Diagnosis not present

## 2020-07-27 ENCOUNTER — Ambulatory Visit: Payer: 59

## 2020-07-28 ENCOUNTER — Observation Stay
Admission: EM | Admit: 2020-07-28 | Discharge: 2020-07-28 | Disposition: A | Discharge status: Home / Self Care | Payer: 59 | Attending: Obstetrics and Gynecology | Admitting: Obstetrics and Gynecology

## 2020-07-28 DIAGNOSIS — Z7982 Long term (current) use of aspirin: Secondary | ICD-10-CM | POA: Insufficient documentation

## 2020-07-28 DIAGNOSIS — O99213 Obesity complicating pregnancy, third trimester: Secondary | ICD-10-CM | POA: Insufficient documentation

## 2020-07-28 DIAGNOSIS — Z794 Long term (current) use of insulin: Secondary | ICD-10-CM | POA: Diagnosis not present

## 2020-07-28 DIAGNOSIS — O24414 Gestational diabetes mellitus in pregnancy, insulin controlled: Secondary | ICD-10-CM | POA: Insufficient documentation

## 2020-07-28 DIAGNOSIS — Z3A32 32 weeks gestation of pregnancy: Secondary | ICD-10-CM | POA: Diagnosis not present

## 2020-07-28 DIAGNOSIS — Z87891 Personal history of nicotine dependence: Secondary | ICD-10-CM | POA: Diagnosis not present

## 2020-07-28 DIAGNOSIS — O30043 Twin pregnancy, dichorionic/diamniotic, third trimester: Secondary | ICD-10-CM | POA: Diagnosis not present

## 2020-07-28 DIAGNOSIS — O0993 Supervision of high risk pregnancy, unspecified, third trimester: Secondary | ICD-10-CM | POA: Insufficient documentation

## 2020-07-28 NOTE — Progress Notes (Signed)
Pt to birthplace for scheduled NST. Pt denies LOF, VB and confirms positive fetal movement in both babies. No pt complaints today. Monitors applied and assessing.

## 2020-07-28 NOTE — Discharge Summary (Signed)
Catherine Lewis is a 35 y.o. female. She is at [redacted]w[redacted]d gestation. Patient's last menstrual period was 12/09/2019. 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 no CTX, no VB.no LOF,  Active fetal movement.    Maternal Medical History:  Past Medical Hx:  has a past medical history of Gestational diabetes and History of hiatal hernia.  Past Surgical Hx:  has a past surgical history that includes Laparoscopic gastric banding with hiatal hernia repair (N/A, 08/06/2016); Cesarean section (2007); and Fasciectomy (Left, 10/08/2019).   No Known Allergies  Prior to Admission medications   Medication Sig Start Date End Date Taking? Authorizing Provider  aspirin EC 81 MG tablet Take 81 mg by mouth daily. Swallow whole.    [provider]  folic acid (FOLVITE) 1 MG tablet Take 1 mg by mouth daily.    [provider]  insulin regular (NOVOLIN R) 100 units/mL injection AM-12 & 30 PM-14-14 04/21/20   [provider]  NOVOLIN N 100 UNIT/ML injection Inject into the skin. 04/25/20   [provider]  Prenatal Vit-Fe Fumarate-FA (MULTIVITAMIN-PRENATAL) 27-0.8 MG TABS tablet Take 1 tablet by mouth daily at 12 noon.    [provider]     Social History: She  reports that she quit smoking about 15 years ago. Her smoking use included cigarettes. She has a 0.50 pack-year smoking history. She has never used smokeless tobacco. She reports previous alcohol use. She reports previous drug use.  Family History: family history includes Diabetes in her paternal grandmother; Melanoma in her father.  no history of gyn cancers  Review  of Systems: A full review of systems was performed and negative except as noted in the HPI.     O:  LMP 12/09/2019  No results found for this or any previous visit (from the past 48 hour(s)).   Constitutional: NAD, AAOx3  CV: RRR PULM: nl respiratory effort    Abd: gravid, non-tender, non-distended, soft      Ext: Non-tender, Nonedmeatous   Psych: mood appropriate, speech normal Pelvic: deferred   NST:  Baby A: Baseline: 135 Variability: moderate Accelerations present x >2 Decelerations absent  Baby B: Baseline: 125 Variability: moderate Accelerations present x >2 Decelerations absent  Toco: quiet   Time   A/P: 35 y.o. [redacted]w[redacted]d  with high risk pregnancy and antepartum surveillance.  Fetal Wellbeing: Reassuring Cat 1 tracing. Reactive NST x 2  D/c home stable, precautions reviewed, follow-up as scheduled.   ----- Randa Ngo, CNM  Certified Nurse Midwife [place]  [facility]

## 2020-08-03 ENCOUNTER — Other Ambulatory Visit: Payer: Self-pay | Admitting: Obstetrics & Gynecology

## 2020-08-03 DIAGNOSIS — O24419 Gestational diabetes mellitus in pregnancy, unspecified control: Secondary | ICD-10-CM

## 2020-08-03 DIAGNOSIS — O09523 Supervision of elderly multigravida, third trimester: Secondary | ICD-10-CM

## 2020-08-03 DIAGNOSIS — O30043 Twin pregnancy, dichorionic/diamniotic, third trimester: Secondary | ICD-10-CM

## 2020-08-03 DIAGNOSIS — O99213 Obesity complicating pregnancy, third trimester: Secondary | ICD-10-CM

## 2020-08-04 ENCOUNTER — Encounter: Payer: Self-pay | Admitting: Obstetrics and Gynecology

## 2020-08-04 ENCOUNTER — Observation Stay
Admission: EM | Admit: 2020-08-04 | Discharge: 2020-08-04 | Disposition: A | Discharge status: Home / Self Care | Payer: 59

## 2020-08-04 ENCOUNTER — Other Ambulatory Visit: Payer: Self-pay

## 2020-08-04 DIAGNOSIS — Z794 Long term (current) use of insulin: Secondary | ICD-10-CM | POA: Diagnosis not present

## 2020-08-04 DIAGNOSIS — E669 Obesity, unspecified: Secondary | ICD-10-CM | POA: Insufficient documentation

## 2020-08-04 DIAGNOSIS — Z3A32 32 weeks gestation of pregnancy: Secondary | ICD-10-CM | POA: Diagnosis not present

## 2020-08-04 DIAGNOSIS — O99213 Obesity complicating pregnancy, third trimester: Secondary | ICD-10-CM | POA: Insufficient documentation

## 2020-08-04 DIAGNOSIS — O30043 Twin pregnancy, dichorionic/diamniotic, third trimester: Secondary | ICD-10-CM | POA: Diagnosis present

## 2020-08-04 DIAGNOSIS — Z7982 Long term (current) use of aspirin: Secondary | ICD-10-CM | POA: Insufficient documentation

## 2020-08-04 DIAGNOSIS — O24414 Gestational diabetes mellitus in pregnancy, insulin controlled: Secondary | ICD-10-CM | POA: Insufficient documentation

## 2020-08-04 DIAGNOSIS — Z87891 Personal history of nicotine dependence: Secondary | ICD-10-CM | POA: Diagnosis not present

## 2020-08-04 DIAGNOSIS — O09893 Supervision of other high risk pregnancies, third trimester: Secondary | ICD-10-CM | POA: Diagnosis present

## 2020-08-04 NOTE — OB Triage Note (Signed)
Pt here for weekly NST for twins

## 2020-08-04 NOTE — Progress Notes (Signed)
Baby A (blue/boy)- [place] applied right lower abd Baby B (yellow/girl)- [place] applied to left upper abd

## 2020-08-04 NOTE — Progress Notes (Signed)
Pt discharged after reactive NST. Pt not available next Friday. Pt scheduled for NST Sunday July 17 at 0930.

## 2020-08-04 NOTE — Discharge Summary (Signed)
.   Catherine Lewis is a 35 y.o. female. She is at [redacted]w[redacted]d gestation. Patient's last menstrual period was 12/09/2019. 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 no CTX, no VB.no LOF,  Active fetal movement.    Maternal Medical History:  Past Medical Hx:  has a past medical history of Gestational diabetes and History of hiatal hernia.  Past Surgical Hx:  has a past surgical history that includes Laparoscopic gastric banding with hiatal hernia repair (N/A, 08/06/2016); Cesarean section (2007); and Fasciectomy (Left, 10/08/2019).   No Known Allergies  Prior to Admission medications   Medication Sig Start Date End Date Taking? Authorizing Provider  aspirin EC 81 MG tablet Take 81 mg by mouth daily. Swallow whole.    [provider]  folic acid (FOLVITE) 1 MG tablet Take 1 mg by mouth daily.    [provider]  insulin regular (NOVOLIN R) 100 units/mL injection AM-12 & 30 PM-14-14 04/21/20   [provider]  NOVOLIN N 100 UNIT/ML injection Inject into the skin. 04/25/20   [provider]  Prenatal Vit-Fe Fumarate-FA (MULTIVITAMIN-PRENATAL) 27-0.8 MG TABS tablet Take 1 tablet by mouth daily at 12 noon.    [provider]     Social History: She  reports that she quit smoking about 15 years ago. Her smoking use included cigarettes. She has a 0.50 pack-year smoking history. She has never used smokeless tobacco. She reports previous alcohol use. She reports previous drug use.  Family History: family history includes Diabetes in her paternal grandmother; Melanoma in her father.  no history of gyn cancers  Review  of Systems: A full review of systems was performed and negative except as noted in the HPI.     O:  BP 121/62 (BP Location: Right Arm)   Pulse 90   Temp 98.2 F (36.8 C) (Oral)   Resp 17   Ht 5\' 6"  (1.676 m)   Wt (!) 152.4 kg   LMP 12/09/2019   BMI 54.23 kg/m  No results found for this or any previous visit (from the past 48 hour(s)).   Constitutional: NAD, AAOx3  CV: RRR PULM: nl respiratory effort    Abd: gravid, non-tender, non-distended, soft  Ext: Non-tender, Nonedmeatous   Psych: mood appropriate, speech normal Pelvic: deferred   NST:  Baby A: (RLQ) Baseline: 130 Variability: moderate Accelerations present x >2 Decelerations absent  Baby B: (LUQ) Baseline: 135 Variability: moderate Accelerations present x >2 Decelerations absent  Toco: irregular mild contractions - patient denies feeling contractions or cramping   Time 30 mins   A/P: 35 y.o. [redacted]w[redacted]d with high risk pregnancy and antepartum surveillance.  Fetal Wellbeing: Reassuring Cat 1 tracing. Reactive NST x 2  D/c home stable, precautions reviewed, follow-up as scheduled.   ----- [redacted]w[redacted]d, CNM Certified Nurse Midwife [place]  [facility]

## 2020-08-08 ENCOUNTER — Ambulatory Visit: Payer: 59 | Attending: Obstetrics and Gynecology

## 2020-08-08 ENCOUNTER — Other Ambulatory Visit: Payer: Self-pay

## 2020-08-08 DIAGNOSIS — O30043 Twin pregnancy, dichorionic/diamniotic, third trimester: Secondary | ICD-10-CM

## 2020-08-08 DIAGNOSIS — Z3A34 34 weeks gestation of pregnancy: Secondary | ICD-10-CM

## 2020-08-08 DIAGNOSIS — O24414 Gestational diabetes mellitus in pregnancy, insulin controlled: Secondary | ICD-10-CM | POA: Diagnosis not present

## 2020-08-08 DIAGNOSIS — O99213 Obesity complicating pregnancy, third trimester: Secondary | ICD-10-CM

## 2020-08-08 DIAGNOSIS — O09523 Supervision of elderly multigravida, third trimester: Secondary | ICD-10-CM | POA: Diagnosis not present

## 2020-08-08 DIAGNOSIS — O24419 Gestational diabetes mellitus in pregnancy, unspecified control: Secondary | ICD-10-CM

## 2020-08-13 ENCOUNTER — Encounter: Payer: Self-pay | Admitting: Obstetrics and Gynecology

## 2020-08-13 ENCOUNTER — Observation Stay
Admission: EM | Admit: 2020-08-13 | Discharge: 2020-08-13 | Disposition: A | Discharge status: Home / Self Care | Payer: 59 | Attending: Obstetrics and Gynecology | Admitting: Obstetrics and Gynecology

## 2020-08-13 ENCOUNTER — Other Ambulatory Visit: Payer: Self-pay

## 2020-08-13 DIAGNOSIS — O99213 Obesity complicating pregnancy, third trimester: Secondary | ICD-10-CM | POA: Diagnosis not present

## 2020-08-13 DIAGNOSIS — O30043 Twin pregnancy, dichorionic/diamniotic, third trimester: Secondary | ICD-10-CM | POA: Diagnosis not present

## 2020-08-13 DIAGNOSIS — Z7982 Long term (current) use of aspirin: Secondary | ICD-10-CM | POA: Diagnosis not present

## 2020-08-13 DIAGNOSIS — O24414 Gestational diabetes mellitus in pregnancy, insulin controlled: Secondary | ICD-10-CM | POA: Diagnosis not present

## 2020-08-13 DIAGNOSIS — Z87891 Personal history of nicotine dependence: Secondary | ICD-10-CM | POA: Diagnosis not present

## 2020-08-13 DIAGNOSIS — Z3A37 37 weeks gestation of pregnancy: Secondary | ICD-10-CM | POA: Insufficient documentation

## 2020-08-13 DIAGNOSIS — O0993 Supervision of high risk pregnancy, unspecified, third trimester: Secondary | ICD-10-CM | POA: Diagnosis not present

## 2020-08-13 DIAGNOSIS — Z794 Long term (current) use of insulin: Secondary | ICD-10-CM | POA: Diagnosis not present

## 2020-08-13 DIAGNOSIS — E669 Obesity, unspecified: Secondary | ICD-10-CM | POA: Insufficient documentation

## 2020-08-13 LAB — COMPREHENSIVE METABOLIC PANEL
ALT: 12 U/L (ref 0–44)
AST: 19 U/L (ref 15–41)
Albumin: 3 g/dL — ABNORMAL LOW (ref 3.5–5.0)
Alkaline Phosphatase: 137 U/L — ABNORMAL HIGH (ref 38–126)
Anion gap: 11 (ref 5–15)
BUN: 10 mg/dL (ref 6–20)
CO2: 24 mmol/L (ref 22–32)
Calcium: 9.2 mg/dL (ref 8.9–10.3)
Chloride: 103 mmol/L (ref 98–111)
Creatinine, Ser: 0.52 mg/dL (ref 0.44–1.00)
GFR, Estimated: >60 mL/min (ref >60)
Glucose, Bld: 94 mg/dL (ref 70–99)
Potassium: 3.8 mmol/L (ref 3.5–5.1)
Sodium: 138 mmol/L (ref 135–145)
Total Bilirubin: 0.8 mg/dL (ref 0.3–1.2)
Total Protein: 6.5 g/dL (ref 6.5–8.1)

## 2020-08-13 LAB — CBC
HCT: 35.2 % — ABNORMAL LOW (ref 36.0–46.0)
Hemoglobin: 12.5 g/dL (ref 12.0–15.0)
MCH: 32.5 pg (ref 26.0–34.0)
MCHC: 35.5 g/dL (ref 30.0–36.0)
MCV: 91.4 fL (ref 80.0–100.0)
Platelets: 173 10*3/uL (ref 150–400)
RBC: 3.85 MIL/uL — ABNORMAL LOW (ref 3.87–5.11)
RDW: 13.9 % (ref 11.5–15.5)
WBC: 8.8 10*3/uL (ref 4.0–10.5)
nRBC: 0 % (ref 0.0–0.2)

## 2020-08-13 LAB — PROTEIN / CREATININE RATIO, URINE
Creatinine, Urine: 211 mg/dL
Protein Creatinine Ratio: 0.1 mg/mg{Cre} (ref 0.00–0.15)
Total Protein, Urine: 21 mg/dL

## 2020-08-13 NOTE — OB Triage Note (Signed)
Pt presents for her scheduled twin NST. Pt denies ctx, bleeding, or LOF. However, pt reports "cooter pinches but understands there is nothing ya'll can do about that". Initial BP elevated. Bps cycling q54mins. Pt denies headache, blurry vision, or epigastric pain. +2 reflexes, no clonus. Pt reports positive fetal movement of both babies. Will continue to monitor.

## 2020-08-13 NOTE — Discharge Summary (Signed)
Catherine Kitchen RENDA Lewis is a 35 y.o. female. She is at [redacted]w[redacted]d gestation. Patient's last menstrual period was 12/09/2019. 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 no CTX, no VB.no LOF,  Active fetal movement.    Maternal Medical History:  Past Medical Hx:  has a past medical history of Gestational diabetes and History of hiatal hernia.  Past Surgical Hx:  has a past surgical history that includes Laparoscopic gastric banding with hiatal hernia repair (N/A, 08/06/2016); Cesarean section (2007); and Fasciectomy (Left, 10/08/2019).   No Known Allergies  Prior to Admission medications   Medication Sig Start Date End Date Taking? Authorizing Provider  aspirin EC 81 MG tablet Take 81 mg by mouth daily. Swallow whole.    [provider]  folic acid (FOLVITE) 1 MG tablet Take 1 mg by mouth daily.    [provider]  insulin regular (NOVOLIN R) 100 units/mL injection AM-12 & 30 PM-14-14 04/21/20   [provider]  NOVOLIN N 100 UNIT/ML injection Inject into the skin. 04/25/20   [provider]  Prenatal Vit-Fe Fumarate-FA (MULTIVITAMIN-PRENATAL) 27-0.8 MG TABS tablet Take 1 tablet by mouth daily at 12 noon.    [provider]     Social History: She  reports that she quit smoking about 15 years ago. Her smoking use included cigarettes. She has a 0.50 pack-year smoking history. She has never used smokeless tobacco. She reports previous alcohol use. She reports previous drug use.  Family History: family history includes Diabetes in her paternal grandmother; Melanoma in her father.  no history of gyn cancers  Review  of Systems: A full review of systems was performed and negative except as noted in the HPI.     O: Elevated BP on arrival:  0942: 151/96 0944: 132/87   BP 131/83   Pulse 81   Temp 98 F (36.7 C) (Oral)   Resp 18   Ht 5\' 6"  (1.676 m)   Wt (!) 154.2 kg   LMP 12/09/2019   SpO2 99%   BMI 54.88 kg/m  Results for orders placed or performed during the hospital encounter of 08/13/20 (from the past 48 hour(s))  Protein / creatinine ratio, urine   Collection Time: 08/13/20 10:00 AM  Result Value Ref Range   Creatinine, Urine 211 mg/dL   Total Protein, Urine 21 mg/dL   Protein Creatinine Ratio 0.10 0.00 - 0.15 mg/mg[Cre]  CBC   Collection Time: 08/13/20 10:05 AM  Result Value Ref Range   WBC 8.8 4.0 - 10.5 K/uL   RBC 3.85 (L) 3.87 - 5.11 MIL/uL   Hemoglobin 12.5 12.0 - 15.0 g/dL   HCT 08/15/20 (L) 02.7 - 25.3 %   MCV 91.4 80.0 - 100.0 fL   MCH 32.5 26.0 - 34.0 pg   MCHC 35.5 30.0 - 36.0 g/dL   RDW 66.4 40.3 - 47.4 %   Platelets 173 150 - 400 K/uL   nRBC 0.0 0.0 - 0.2 %  Comprehensive metabolic panel   Collection Time: 08/13/20 10:05 AM  Result Value Ref Range   Sodium 138 135 - 145 mmol/L   Potassium 3.8 3.5 - 5.1 mmol/L  Chloride 103 98 - 111 mmol/L   CO2 24 22 - 32 mmol/L   Glucose, Bld 94 70 - 99 mg/dL   BUN 10 6 - 20 mg/dL   Creatinine, Ser 4.09 0.44 - 1.00 mg/dL   Calcium 9.2 8.9 - 81.1 mg/dL   Total Protein 6.5 6.5 - 8.1 g/dL   Albumin 3.0 (L) 3.5 - 5.0 g/dL   AST 19 15 - 41 U/L   ALT 12 0 - 44 U/L   Alkaline Phosphatase 137 (H) 38 - 126 U/L   Total Bilirubin 0.8 0.3 - 1.2 mg/dL   GFR, Estimated >91 >47 mL/min   Anion gap 11 5 - 15     Constitutional: NAD, AAOx3  CV: RRR PULM: nl respiratory effort    Abd: gravid, non-tender, non-distended, soft  Ext: Non-tender, Nonedmeatous   Psych: mood appropriate, speech normal Pelvic: deferred   NST:  Baby A: (RLQ) Baseline: 130 Variability: moderate Accelerations present x >2 Decelerations absent  Baby B:  (LUQ) Baseline: 140 Variability: moderate Accelerations present x >2 Decelerations absent  Toco: no UCs noted, mild UI  Time 20 mins   A/P: 35 y.o. [redacted]w[redacted]d with high risk pregnancy and antepartum surveillance.  Fetal Wellbeing: Reassuring Cat 1 tracing. Reactive NST x 2  Elevated BP x 1, PIH labs completed, WNL.  Has f/u clinic appt 08/15/20 D/c home stable, precautions reviewed, follow-up as scheduled.   ----- Randa Ngo, CNM  Certified Nurse Midwife [place]  [facility]

## 2020-08-13 NOTE — Discharge Planning (Signed)
RN reviewed discharge instructions with patient and went over red flags of BP disorders in pregnancy. Gave pt opportunity for questions. All questions answered at this time. Pt verbalized understanding. Pt discharged home.

## 2020-08-15 ENCOUNTER — Encounter
Admission: RE | Admit: 2020-08-15 | Discharge: 2020-08-15 | Disposition: A | Discharge status: Home / Self Care | Payer: 59 | Source: Ambulatory Visit | Attending: Anesthesiology | Admitting: Anesthesiology

## 2020-08-15 ENCOUNTER — Other Ambulatory Visit: Payer: Self-pay

## 2020-08-15 NOTE — Consult Note (Signed)
[facility] Anesthesia Consultation  Catherine Lewis OVF:[id number] DOB: Jun 14, 1985 DOA: 08/15/2020 PCP: Suzy Bouchard, MD   Requesting physician: Dr. Dalbert Garnet Date of consultation: 08/15/20 Reason for consultation: Obesity during pregnancy  CHIEF COMPLAINT:  Obesity during pregnancy  HISTORY OF PRESENT ILLNESS: Catherine Lewis  is a 35 y.o. female with a known history of obesity during pregnancy. This is a twin pregnancy. Prior hx of cesarean delivery due to LGA infant (15 years ago). Currently being treated for gDM. Denies personal or family hx of bleeding disorders. Planning for scheduled cesarean delivery.   PAST MEDICAL HISTORY:   Past Medical History:  Diagnosis Date   Gestational diabetes    History of hiatal hernia     PAST SURGICAL HISTORY:  Past Surgical History:  Procedure Laterality Date   CESAREAN SECTION  2007   FASCIECTOMY Left 10/08/2019   Procedure: PARTIAL PLANTAR FASCIECTOMY WITH HEEL RESECTION OF LEFT FOOT;  Surgeon: Linus Galas, DPM;  Location: [facility] ORS;  Service: Podiatry;  Laterality: Left;   LAPAROSCOPIC GASTRIC BANDING WITH HIATAL HERNIA REPAIR N/A 08/06/2016   Procedure: LAPAROSCOPIC GASTRIC BANDING WITH HIATAL HERNIA REPAIR;  Surgeon: Gaynelle Adu, MD;  Location: [facility] ORS;  Service: General;  Laterality: N/A;    SOCIAL HISTORY:  Social History   Tobacco Use   Smoking status: Former    Packs/day: 0.25    Years: 2.00    Pack years: 0.50    Types: Cigarettes    Quit date: 12/04/2004    Years since quitting: 15.7   Smokeless tobacco: Never  Substance Use Topics   Alcohol use: Not Currently    FAMILY HISTORY:  Family History  Problem Relation Age of Onset   Melanoma Father    Diabetes Paternal Grandmother     DRUG ALLERGIES: No Known Allergies  REVIEW OF SYSTEMS:   RESPIRATORY: No cough, shortness of breath, wheezing.  CARDIOVASCULAR: No chest pain, orthopnea, edema.  HEMATOLOGY: No anemia, easy  bruising or bleeding SKIN: No rash or lesion. NEUROLOGIC: No tingling, numbness, weakness.  PSYCHIATRY: No anxiety or depression.   MEDICATIONS AT HOME:  Prior to Admission medications   Medication Sig Start Date End Date Taking? Authorizing Provider  aspirin EC 81 MG tablet Take 81 mg by mouth daily. Swallow whole.    [provider]  folic acid (FOLVITE) 1 MG tablet Take 1 mg by mouth daily.    [provider]  insulin regular (NOVOLIN R) 100 units/mL injection AM-12 & 30 PM-14-14 04/21/20   [provider]  NOVOLIN N 100 UNIT/ML injection Inject into the skin. 04/25/20   [provider]  Prenatal Vit-Fe Fumarate-FA (MULTIVITAMIN-PRENATAL) 27-0.8 MG TABS tablet Take 1 tablet by mouth daily at 12 noon.    [provider]      PHYSICAL EXAMINATION:   VITAL SIGNS: Last menstrual period 12/09/2019.  GENERAL:  35 y.o.-year-old patient no acute distress.  HEENT: Head atraumatic, normocephalic. Oropharynx and nasopharynx clear. MP 2, TM distance >3 cm, normal mouth opening, grade 1 upper lip bite. LUNGS: No use of accessory muscles of respiration.   EXTREMITIES: No pedal edema, cyanosis, or clubbing.  NEUROLOGIC: normal gait PSYCHIATRIC: The patient is alert and oriented x 3.  SKIN: No obvious rash, lesion, or ulcer.    IMPRESSION AND PLAN:   Catherine Lewis  is a 35 y.o. female presenting with obesity during pregnancy. BMI is currently 54 at [redacted] weeks gestation.   Airway exam reassuring. Spinal interspaces minimally palpable.   Currently  planning for repeat cesarean delivery. Per patient, because of obesity and pannus planning for supraumbilical skin incision, will confirm this with OB provider. Would recommend CSE for anesthesia to have back up epidural catheter available given high skin incision.   Briefly discussed that if she came in in labor and wanted to [facility] we would recommend epidural placement.  Discussed increased risk of  difficult intubation during pregnancy should an emergency cesarean delivery be required.   Plan for delivery at [facility].

## 2020-08-18 ENCOUNTER — Observation Stay
Admission: EM | Admit: 2020-08-18 | Discharge: 2020-08-18 | Disposition: A | Discharge status: Home / Self Care | Payer: 59 | Attending: Obstetrics and Gynecology | Admitting: Obstetrics and Gynecology

## 2020-08-18 ENCOUNTER — Encounter: Payer: Self-pay | Admitting: Obstetrics and Gynecology

## 2020-08-18 ENCOUNTER — Other Ambulatory Visit: Payer: Self-pay

## 2020-08-18 DIAGNOSIS — Z7982 Long term (current) use of aspirin: Secondary | ICD-10-CM | POA: Diagnosis not present

## 2020-08-18 DIAGNOSIS — Z87891 Personal history of nicotine dependence: Secondary | ICD-10-CM | POA: Insufficient documentation

## 2020-08-18 DIAGNOSIS — O99213 Obesity complicating pregnancy, third trimester: Secondary | ICD-10-CM | POA: Insufficient documentation

## 2020-08-18 DIAGNOSIS — O0993 Supervision of high risk pregnancy, unspecified, third trimester: Principal | ICD-10-CM | POA: Insufficient documentation

## 2020-08-18 DIAGNOSIS — O99820 Streptococcus B carrier state complicating pregnancy: Secondary | ICD-10-CM | POA: Insufficient documentation

## 2020-08-18 DIAGNOSIS — Z794 Long term (current) use of insulin: Secondary | ICD-10-CM | POA: Diagnosis not present

## 2020-08-18 DIAGNOSIS — O30043 Twin pregnancy, dichorionic/diamniotic, third trimester: Secondary | ICD-10-CM | POA: Insufficient documentation

## 2020-08-18 DIAGNOSIS — E669 Obesity, unspecified: Secondary | ICD-10-CM | POA: Diagnosis not present

## 2020-08-18 DIAGNOSIS — Z3A36 36 weeks gestation of pregnancy: Secondary | ICD-10-CM | POA: Diagnosis not present

## 2020-08-18 DIAGNOSIS — O24414 Gestational diabetes mellitus in pregnancy, insulin controlled: Secondary | ICD-10-CM | POA: Diagnosis not present

## 2020-08-18 NOTE — OB Triage Note (Signed)
Pt is here for schedueld NST for twin pregnancy. Pt states positive FM, Denies VB, LOF. EFM applied

## 2020-08-18 NOTE — Discharge Summary (Signed)
.   Catherine Lewis is a 35 y.o. female. She is at [redacted]w[redacted]d gestation. Patient's last menstrual period was 12/09/2019. 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 no CTX, no VB.no LOF,  Active fetal movement.    Maternal Medical History:  Past Medical Hx:  has a past medical history of Gestational diabetes and History of hiatal hernia.  Past Surgical Hx:  has a past surgical history that includes Laparoscopic gastric banding with hiatal hernia repair (N/A, 08/06/2016); Cesarean section (2007); and Fasciectomy (Left, 10/08/2019).   No Known Allergies  Prior to Admission medications   Medication Sig Start Date End Date Taking? Authorizing Provider  aspirin EC 81 MG tablet Take 81 mg by mouth daily. Swallow whole.    [provider]  folic acid (FOLVITE) 1 MG tablet Take 1 mg by mouth daily.    [provider]  insulin regular (NOVOLIN R) 100 units/mL injection AM-12 & 30 PM-14-14 04/21/20   [provider]  NOVOLIN N 100 UNIT/ML injection Inject into the skin. 04/25/20   [provider]  Prenatal Vit-Fe Fumarate-FA (MULTIVITAMIN-PRENATAL) 27-0.8 MG TABS tablet Take 1 tablet by mouth daily at 12 noon.    [provider]     Social History: She  reports that she quit smoking about 15 years ago. Her smoking use included cigarettes. She has a 0.50 pack-year smoking history. She has never used smokeless tobacco. She reports previous alcohol use. She reports previous drug use.  Family History: family history includes Diabetes in her paternal grandmother; Melanoma in her father.  no history of gyn cancers  Review  of Systems: A full review of systems was performed and negative except as noted in the HPI.     O: Normal BP today.    BP 136/60 (BP Location: Right Arm)   Resp 20   Ht 5\' 6"  (1.676 m)   Wt (!) 156.9 kg   LMP 12/09/2019   BMI 55.85 kg/m  No results found for this or any previous visit (from the past 48 hour(s)).    Constitutional: NAD, AAOx3  CV: RRR PULM: nl respiratory effort    Abd: gravid, non-tender, non-distended, soft  Ext: Non-tender, Nonedmeatous   Psych: mood appropriate, speech normal Pelvic: deferred   NST:  Baby A: (RLQ) Baseline: 130 Variability: moderate Accelerations present x >2 Decelerations absent  Baby B: (LUQ) Baseline: 135 Variability: moderate Accelerations present x >2 Decelerations absent  Toco: no UCs noted, mild UI  Time 20 mins   A/P: 35 y.o. [redacted]w[redacted]d with high risk pregnancy and antepartum surveillance.  Fetal Wellbeing: Reassuring Cat 1 tracing. Reactive NST x 2  D/c home stable, precautions reviewed, follow-up as scheduled.   ----- [redacted]w[redacted]d, CNM  Certified Nurse Midwife [place]  [facility]

## 2020-08-18 NOTE — H&P (Signed)
Obstetric Preoperative History and Physical  Zakiah Gauthreaux Collison is a 35 y.o. G2P1001 with IUP at [redacted]w[redacted]d presenting for scheduled cesarean section.  No acute concerns.   She developed elevated BP and yesterday her P:C ratio confirmed preE without severe features.   Prenatal Course Source of Care: [facility]  Pregnancy complications or risks:  - Obesity - Pre-pregnancy BMI 51.25, currently 55.85             -Discussed TWG 11-15lbs             -03/14/20: anesthesia consult completed; return visit at 34 weeks with Dr. Priscella Mann with go-ahead for surgery at [facility]  - Di/Di twins             -MFM appt 02/10/20, anatomy done 04/20/20             -Recommendations:                         -Fetal echo ordered 04/20/20 by MFM for inadequate fetal heart views: scheduled 05/19/20                         -Scheduled with genetics counselor                         -Low dose ASA - taking              -Antenatal testing:                          -All NSTs to be done at L&D - A2GDM             -qAM- 30 units NPH, 12 units Reg; qPM-14 units NPH, 14 units Reg   - well controlled   -GBS bacteruria              -Treated with amoxicillin   - Previous LTCS with macrosomia             -C/s done for: 42 week pregnancy; 9lbs 10 oz             -Considering TOLAC, but she prefers whatever is safest              -Desires BTL   - History of heart murmur as child - History of Gastric Sleeve   Patient Active Problem List   Diagnosis Date Noted   Supervision of high risk pregnancy in third trimester 08/22/2020   Elevated blood pressure affecting pregnancy in third trimester, antepartum 08/21/2020   Dichorionic diamniotic twin pregnancy in third trimester 07/23/2020   She plans to breastfeed She desires bilateral tubal ligation for postpartum contraception.   Prenatal labs and studies: ABO, Rh: --/--/O POS (07/25 1739) Antibody: NEG (07/25 1739) Rubella: Immune (02/01 0000) RPR: Nonreactive (02/01 0000)  HBsAg:  Negative (02/01 0000)  HIV: Non-reactive (02/01 0000)  HXT:AVWPVXYI/-- (06/15 0000)  03/01/2020: 1hr GTT 156 at 12 weeks  3hr GTT: 104, 188, 154, 51 Genetic screening  inconclusive Anatomy US normal  Prenatal Transfer Tool  Maternal Diabetes: Yes:  Diabetes Type:  Insulin/Medication controlled Genetic Screening: Normal Maternal Ultrasounds/Referrals: Other: Fetal Ultrasounds or other Referrals:  Fetal echo Maternal Substance Abuse:  No Significant Maternal Medications:  Meds include: Other:  Significant Maternal Lab Results: Group B Strep positive  Past Medical History:  Diagnosis Date   Gestational diabetes    History of hiatal  hernia     Past Surgical History:  Procedure Laterality Date   CESAREAN SECTION  2007   FASCIECTOMY Left 10/08/2019   Procedure: PARTIAL PLANTAR FASCIECTOMY WITH HEEL RESECTION OF LEFT FOOT;  Surgeon: Linus Galas, DPM;  Location: [facility] ORS;  Service: Podiatry;  Laterality: Left;   LAPAROSCOPIC GASTRIC BANDING WITH HIATAL HERNIA REPAIR N/A 08/06/2016   Procedure: LAPAROSCOPIC GASTRIC BANDING WITH HIATAL HERNIA REPAIR;  Surgeon: Gaynelle Adu, MD;  Location: [facility]S;  Service: General;  Laterality: N/A;    OB History  Gravida Para Term Preterm AB Living  2 1 1     1   SAB IAB Ectopic Multiple Live Births               # Outcome Date GA Lbr Len/2nd Weight Sex Delivery Anes PTL Lv  2 Current           1 Term 07/22/05   4366 g  [id number]-[id number]       Social History   Socioeconomic History   Marital status: Married    Spouse name: 07/24/05   Number of children: Not on file   Years of education: Not on file   Highest education level: Not on file  Occupational History   Not on file  Tobacco Use   Smoking status: Former    Packs/day: 0.25    Years: 2.00    Pack years: 0.50    Types: Cigarettes    Quit date: 12/04/2004    Years since quitting: 15.7   Smokeless tobacco: Never  Vaping Use   Vaping Use: Never used  Substance and Sexual Activity   Alcohol  use: Not Currently   Drug use: Not Currently    Comment: fifteen years ago   Sexual activity: Yes    Birth control/protection: Surgical  Other Topics Concern   Not on file  Social History Narrative   Not on file   Social Determinants of Health   Financial Resource Strain: Not on file  Food Insecurity: Not on file  Transportation Needs: Not on file  Physical Activity: Not on file  Stress: Not on file  Social Connections: Not on file    Family History  Problem Relation Age of Onset   Melanoma Father    Diabetes Paternal Grandmother     Medications Prior to Admission  Medication Sig Dispense Refill Last Dose   folic acid (FOLVITE) 1 MG tablet Take 1 mg by mouth daily.   08/21/2020   insulin regular (NOVOLIN R) 100 units/mL injection AM-12 & 30 PM-14-14   08/21/2020   NOVOLIN N 100 UNIT/ML injection Inject into the skin.   08/21/2020   Prenatal Vit-Fe Fumarate-FA (MULTIVITAMIN-PRENATAL) 27-0.8 MG TABS tablet Take 1 tablet by mouth daily at 12 noon.   08/21/2020    No Known Allergies  Review of Systems: Negative except for what is mentioned in HPI.  Physical Exam: BP (!) 146/80 (BP Location: Left Leg)   Pulse 88   Temp 98.1 F (36.7 C) (Oral)   Resp 20   Ht 5\' 6"  (1.676 m)   Wt (!) 160.1 kg   LMP 12/09/2019   BMI 56.98 kg/m  Baby A: FHR by Doppler: 140 bpm Baby B: FHR by Doppler: 155 bpm CONSTITUTIONAL: Well-developed, well-nourished female in no acute distress.  NECK: Normal range of motion, supple, no masses SKIN: Skin is warm and dry. No rash noted. Not diaphoretic. No erythema. No pallor.  NEUROLGIC: Alert and oriented to person, place, and time. Normal reflexes,  muscle tone coordination.  PSYCHIATRIC: Normal mood and affect. Normal behavior. Normal judgment and thought content. CARDIOVASCULAR: Normal heart rate noted, regular rhythm RESPIRATORY: Effort and breath sounds normal, no problems with respiration noted ABDOMEN: Soft, nontender, nondistended, gravid.  Well-healed Pfannenstiel incision. PELVIC: Deferred MUSCULOSKELETAL: Normal range of motion. No edema and no tenderness.    Pertinent Labs/Studies:   Results for orders placed or performed during the hospital encounter of 08/22/20 (from the past 72 hour(s))  Glucose, capillary     Status: None   Collection Time: 08/22/20  6:47 AM  Result Value Ref Range   Glucose-Capillary 82 70 - 99 mg/dL    Comment: Glucose reference range applies only to samples taken after fasting for at least 8 hours.    Assessment and Plan :ERIELLE GAWRONSKI is a 35 y.o. G2P1001 at [redacted]w[redacted]d being admitted for scheduled cesarean section.   Her BMI is the most salient to the surgery, and we will use Traxi and Alexis as needed. Desires BTL Di-Di twins Needs a birth Environmental manager for the surgery itself  The risks of cesarean section discussed with the patient included but were not limited to: bleeding which may require transfusion or reoperation; infection which may require antibiotics; injury to bowel, bladder, ureters or other surrounding organs; injury to the fetus; need for additional procedures including hysterectomy in the event of a life-threatening hemorrhage; placental abnormalities wth subsequent pregnancies, incisional problems, thromboembolic phenomenon and other postoperative/anesthesia complications. The patient concurred with the proposed plan, giving informed written consent for the procedure. Patient has been NPO since last night she will remain NPO for procedure. Anesthesia and OR aware. Preoperative prophylactic antibiotics and SCDs ordered on call to the OR. To OR when ready.   Christeen Douglas, MD, MPH, [facility]

## 2020-08-18 NOTE — OB Triage Note (Signed)
Patient Discharged home per provider. Pt educated about labor precautions and informed when to return to the ED for further evaluation. Pt instructed to keep all follow up appointments with her provider. AVS given to patient and RN answered all questions and patient has no further questions at this time. Pt discharged home in stable condition with significant other.   

## 2020-08-21 ENCOUNTER — Other Ambulatory Visit: Payer: Self-pay

## 2020-08-21 ENCOUNTER — Other Ambulatory Visit: Payer: Self-pay | Admitting: Certified Nurse Midwife

## 2020-08-21 ENCOUNTER — Observation Stay
Admission: EM | Admit: 2020-08-21 | Discharge: 2020-08-21 | Disposition: A | Discharge status: Home / Self Care | Payer: 59 | Source: Home / Self Care | Admitting: Obstetrics and Gynecology

## 2020-08-21 DIAGNOSIS — O24419 Gestational diabetes mellitus in pregnancy, unspecified control: Secondary | ICD-10-CM | POA: Insufficient documentation

## 2020-08-21 DIAGNOSIS — Z3A36 36 weeks gestation of pregnancy: Secondary | ICD-10-CM | POA: Insufficient documentation

## 2020-08-21 DIAGNOSIS — R030 Elevated blood-pressure reading, without diagnosis of hypertension: Secondary | ICD-10-CM | POA: Insufficient documentation

## 2020-08-21 DIAGNOSIS — O99213 Obesity complicating pregnancy, third trimester: Secondary | ICD-10-CM | POA: Insufficient documentation

## 2020-08-21 DIAGNOSIS — O26893 Other specified pregnancy related conditions, third trimester: Secondary | ICD-10-CM | POA: Insufficient documentation

## 2020-08-21 DIAGNOSIS — E669 Obesity, unspecified: Secondary | ICD-10-CM | POA: Insufficient documentation

## 2020-08-21 DIAGNOSIS — O99820 Streptococcus B carrier state complicating pregnancy: Secondary | ICD-10-CM | POA: Insufficient documentation

## 2020-08-21 DIAGNOSIS — O30043 Twin pregnancy, dichorionic/diamniotic, third trimester: Secondary | ICD-10-CM | POA: Insufficient documentation

## 2020-08-21 DIAGNOSIS — Z20822 Contact with and (suspected) exposure to covid-19: Secondary | ICD-10-CM | POA: Insufficient documentation

## 2020-08-21 DIAGNOSIS — O163 Unspecified maternal hypertension, third trimester: Secondary | ICD-10-CM | POA: Diagnosis present

## 2020-08-21 LAB — COMPREHENSIVE METABOLIC PANEL
ALT: 13 U/L (ref 0–44)
AST: 19 U/L (ref 15–41)
Albumin: 3 g/dL — ABNORMAL LOW (ref 3.5–5.0)
Alkaline Phosphatase: 142 U/L — ABNORMAL HIGH (ref 38–126)
Anion gap: 7 (ref 5–15)
BUN: 12 mg/dL (ref 6–20)
CO2: 23 mmol/L (ref 22–32)
Calcium: 9.1 mg/dL (ref 8.9–10.3)
Chloride: 105 mmol/L (ref 98–111)
Creatinine, Ser: 0.56 mg/dL (ref 0.44–1.00)
GFR, Estimated: >60 mL/min (ref >60)
Glucose, Bld: 81 mg/dL (ref 70–99)
Potassium: 3.8 mmol/L (ref 3.5–5.1)
Sodium: 135 mmol/L (ref 135–145)
Total Bilirubin: 0.7 mg/dL (ref 0.3–1.2)
Total Protein: 6.5 g/dL (ref 6.5–8.1)

## 2020-08-21 LAB — URINALYSIS, ROUTINE W REFLEX MICROSCOPIC
Bilirubin Urine: NEGATIVE
Glucose, UA: NEGATIVE mg/dL
Ketones, ur: NEGATIVE mg/dL
Nitrite: POSITIVE — AB
Protein, ur: 100 mg/dL — AB
Specific Gravity, Urine: 1.025 (ref 1.005–1.030)
WBC, UA: >50 WBC/hpf — ABNORMAL HIGH (ref 0–5)
pH: 5 (ref 5.0–8.0)

## 2020-08-21 LAB — RESP PANEL BY RT-PCR (FLU A&B, COVID) ARPGX2
Influenza A by PCR: NEGATIVE
Influenza B by PCR: NEGATIVE
SARS Coronavirus 2 by RT PCR: NEGATIVE

## 2020-08-21 LAB — CBC
HCT: 36.1 % (ref 36.0–46.0)
Hemoglobin: 12.9 g/dL (ref 12.0–15.0)
MCH: 31.9 pg (ref 26.0–34.0)
MCHC: 35.7 g/dL (ref 30.0–36.0)
MCV: 89.4 fL (ref 80.0–100.0)
Platelets: 192 10*3/uL (ref 150–400)
RBC: 4.04 MIL/uL (ref 3.87–5.11)
RDW: 13.7 % (ref 11.5–15.5)
WBC: 9.5 10*3/uL (ref 4.0–10.5)
nRBC: 0 % (ref 0.0–0.2)

## 2020-08-21 LAB — TYPE AND SCREEN
ABO/RH(D): O POS
Antibody Screen: NEGATIVE

## 2020-08-21 LAB — PROTEIN / CREATININE RATIO, URINE
Creatinine, Urine: 130 mg/dL
Protein Creatinine Ratio: 0.42 mg/mg{Cre} — ABNORMAL HIGH (ref 0.00–0.15)
Total Protein, Urine: 55 mg/dL

## 2020-08-21 LAB — ABO/RH: ABO/RH(D): O POS

## 2020-08-21 MED ORDER — LABETALOL HCL 5 MG/ML IV SOLN: 40.0000 mg | INTRAVENOUS | Status: DC | PRN

## 2020-08-21 MED ORDER — LABETALOL HCL 5 MG/ML IV SOLN: 80.0000 mg | INTRAVENOUS | Status: DC | PRN

## 2020-08-21 MED ORDER — LABETALOL HCL 5 MG/ML IV SOLN: 20.0000 mg | INTRAVENOUS | Status: DC | PRN

## 2020-08-21 NOTE — Discharge Summary (Signed)
Patient ID: Catherine Lewis MRN: [id number] DOB/AGE: 1985/11/12 35 y.o.  Admit date: 08/21/2020 Discharge date: 08/21/2020  Admission Diagnoses: 35yo G2P1 at [redacted]w[redacted]d was sent to triage from the office with an elevated BP of 141/93.   Factors complicating pregnancy: 1. Obesity in pregnancy - BMI 51.25 2. Di/Di twins 3. A2GDM 4. GBS bacteruria  5. Previous LTCS with macrosomia  6. History of heart murmer as child 7. History of Gastric Sleeve  Discharge Diagnoses: Planned c/s for 0730 tomorrow morning  Prenatal Procedures: NST  Consults: None  Significant Diagnostic Studies:  Results for orders placed or performed during the hospital encounter of 08/21/20 (from the past 168 hour(s))  Comprehensive metabolic panel   Collection Time: 08/21/20  2:51 PM  Result Value Ref Range   Sodium 135 135 - 145 mmol/L   Potassium 3.8 3.5 - 5.1 mmol/L   Chloride 105 98 - 111 mmol/L   CO2 23 22 - 32 mmol/L   Glucose, Bld 81 70 - 99 mg/dL   BUN 12 6 - 20 mg/dL   Creatinine, Ser 6.06 0.44 - 1.00 mg/dL   Calcium 9.1 8.9 - 30.1 mg/dL   Total Protein 6.5 6.5 - 8.1 g/dL   Albumin 3.0 (L) 3.5 - 5.0 g/dL   AST 19 15 - 41 U/L   ALT 13 0 - 44 U/L   Alkaline Phosphatase 142 (H) 38 - 126 U/L   Total Bilirubin 0.7 0.3 - 1.2 mg/dL   GFR, Estimated >60 >10 mL/min   Anion gap 7 5 - 15  CBC   Collection Time: 08/21/20  2:51 PM  Result Value Ref Range   WBC 9.5 4.0 - 10.5 K/uL   RBC 4.04 3.87 - 5.11 MIL/uL   Hemoglobin 12.9 12.0 - 15.0 g/dL   HCT 93.2 35.5 - 73.2 %   MCV 89.4 80.0 - 100.0 fL   MCH 31.9 26.0 - 34.0 pg   MCHC 35.7 30.0 - 36.0 g/dL   RDW 20.2 54.2 - 70.6 %   Platelets 192 150 - 400 K/uL   nRBC 0.0 0.0 - 0.2 %  Protein / creatinine ratio, urine   Collection Time: 08/21/20  2:52 PM  Result Value Ref Range   Creatinine, Urine 130 mg/dL   Total Protein, Urine 55 mg/dL   Protein Creatinine Ratio 0.42 (H) 0.00 - 0.15 mg/mg[Cre]  Urinalysis, Routine w reflex microscopic Urine, Clean Catch    Collection Time: 08/21/20  2:52 PM  Result Value Ref Range   Color, Urine YELLOW (A) YELLOW   APPearance CLOUDY (A) CLEAR   Specific Gravity, Urine 1.025 1.005 - 1.030   pH 5.0 5.0 - 8.0   Glucose, UA NEGATIVE NEGATIVE mg/dL   Hgb urine dipstick MODERATE (A) NEGATIVE   Bilirubin Urine NEGATIVE NEGATIVE   Ketones, ur NEGATIVE NEGATIVE mg/dL   Protein, ur 237 (A) NEGATIVE mg/dL   Nitrite POSITIVE (A) NEGATIVE   Leukocytes,Ua LARGE (A) NEGATIVE   RBC / HPF 11-20 0 - 5 RBC/hpf   WBC, UA >50 (H) 0 - 5 WBC/hpf   Bacteria, UA MANY (A) NONE SEEN   Squamous Epithelial / LPF 0-5 0 - 5   Mucus PRESENT     Treatments: none  Hospital Course:  This is a 35 y.o. G2P1001 with IUP at [redacted]w[redacted]d seen for elevated BPs.    08/21/20 1655 119/68  08/21/20 1637 128/74  08/21/20 1624 130/78  08/21/20 1607 124/49 Abnormal   08/21/20 1552 115/60  08/21/20 1539 114/70  08/21/20  1522 127/71  08/21/20 1501 131/63   Labs as above.  NST: FHR baseline: 130 bpm Variability: moderate Accelerations: yes Decelerations: none Category/reactivity: reactive  FHR baseline: 130 bpm Variability: moderate Accelerations: yes Decelerations: none Category/reactivity: reactive  TOCO: quiet SVE: deferred   C/w Dr. Feliberto Gottron and Dr. Dalbert Garnet. Dr. Dalbert Garnet came to bedside to discuss POC with pt. POC - Pt will go home to get her things together and return at 0530 in the morning fpr a scheduled c/s. Pt is to continue her NPH insulin (and not her regular insulin) tonight and then she will be NPO at midnight.  She was deemed stable for discharge to home by Dr. Dalbert Garnet.  Discharge Physical Exam:  BP 119/68   Pulse 78   Resp 18   LMP 12/09/2019   General: NAD CV: RRR Pulm: CTABL, nl effort ABD: s/nd/nt, gravid DVT Evaluation: LE non-ttp, no evidence of DVT on exam.      Discharge Condition: Stable  Disposition: Discharge disposition: 01-Home or Self Care       Allergies as of 08/21/2020   No  Known Allergies      Medication List     STOP taking these medications    aspirin EC 81 MG tablet       TAKE these medications    folic acid 1 MG tablet Commonly known as: FOLVITE Take 1 mg by mouth daily.   insulin regular 100 units/mL injection Commonly known as: NOVOLIN R AM-12 & 30 PM-14-14   multivitamin-prenatal 27-0.8 MG Tabs tablet Take 1 tablet by mouth daily at 12 noon.   NovoLIN N 100 UNIT/ML injection Generic drug: insulin NPH Human Inject into the skin.         SignedHaroldine Laws, CNM 08/21/2020 5:55 PM

## 2020-08-21 NOTE — OB Triage Note (Signed)
Patient is here for NST for DiDi twins and PIH labs

## 2020-08-22 ENCOUNTER — Inpatient Hospital Stay
Admission: AD | Admit: 2020-08-22 | Discharge: 2020-08-25 | DRG: 784 | Disposition: A | Discharge status: Home / Self Care | Payer: 59 | Source: Intra-hospital | Attending: Obstetrics and Gynecology | Admitting: Obstetrics and Gynecology

## 2020-08-22 ENCOUNTER — Encounter
Admission: AD | Disposition: A | Discharge status: Home / Self Care | Payer: Self-pay | Attending: Obstetrics and Gynecology

## 2020-08-22 ENCOUNTER — Inpatient Hospital Stay: Payer: 59 | Admitting: Anesthesiology

## 2020-08-22 ENCOUNTER — Inpatient Hospital Stay: Admission: RE | Admit: 2020-08-22 | Payer: 59 | Source: Ambulatory Visit

## 2020-08-22 ENCOUNTER — Encounter: Payer: Self-pay | Admitting: Obstetrics and Gynecology

## 2020-08-22 DIAGNOSIS — O1404 Mild to moderate pre-eclampsia, complicating childbirth: Secondary | ICD-10-CM | POA: Diagnosis present

## 2020-08-22 DIAGNOSIS — O9081 Anemia of the puerperium: Secondary | ICD-10-CM | POA: Diagnosis not present

## 2020-08-22 DIAGNOSIS — O3660X0 Maternal care for excessive fetal growth, unspecified trimester, not applicable or unspecified: Secondary | ICD-10-CM | POA: Diagnosis present

## 2020-08-22 DIAGNOSIS — O99824 Streptococcus B carrier state complicating childbirth: Secondary | ICD-10-CM | POA: Diagnosis present

## 2020-08-22 DIAGNOSIS — O99214 Obesity complicating childbirth: Secondary | ICD-10-CM | POA: Diagnosis present

## 2020-08-22 DIAGNOSIS — Z87891 Personal history of nicotine dependence: Secondary | ICD-10-CM

## 2020-08-22 DIAGNOSIS — O30043 Twin pregnancy, dichorionic/diamniotic, third trimester: Secondary | ICD-10-CM | POA: Diagnosis present

## 2020-08-22 DIAGNOSIS — D62 Acute posthemorrhagic anemia: Secondary | ICD-10-CM | POA: Diagnosis not present

## 2020-08-22 DIAGNOSIS — Z302 Encounter for sterilization: Secondary | ICD-10-CM

## 2020-08-22 DIAGNOSIS — O99844 Bariatric surgery status complicating childbirth: Secondary | ICD-10-CM | POA: Diagnosis present

## 2020-08-22 DIAGNOSIS — O24424 Gestational diabetes mellitus in childbirth, insulin controlled: Secondary | ICD-10-CM | POA: Diagnosis present

## 2020-08-22 DIAGNOSIS — R030 Elevated blood-pressure reading, without diagnosis of hypertension: Secondary | ICD-10-CM | POA: Diagnosis present

## 2020-08-22 DIAGNOSIS — O34211 Maternal care for low transverse scar from previous cesarean delivery: Secondary | ICD-10-CM | POA: Diagnosis present

## 2020-08-22 DIAGNOSIS — Z3A36 36 weeks gestation of pregnancy: Secondary | ICD-10-CM | POA: Diagnosis not present

## 2020-08-22 DIAGNOSIS — O0993 Supervision of high risk pregnancy, unspecified, third trimester: Secondary | ICD-10-CM

## 2020-08-22 LAB — GLUCOSE, CAPILLARY
Glucose-Capillary: 82 mg/dL (ref 70–99)
Glucose-Capillary: 92 mg/dL (ref 70–99)

## 2020-08-22 LAB — RPR: RPR Ser Ql: NONREACTIVE

## 2020-08-22 SURGERY — Surgical Case
Anesthesia: Spinal | Laterality: Bilateral

## 2020-08-22 MED ORDER — LACTATED RINGERS IV SOLN: INTRAVENOUS | Status: DC

## 2020-08-22 MED ORDER — WITCH HAZEL-GLYCERIN EX PADS: 1.0000 "application " | MEDICATED_PAD | CUTANEOUS | Status: DC | PRN

## 2020-08-22 MED ORDER — COCONUT OIL OIL: 1.0000 "application " | TOPICAL_OIL | Status: DC | PRN

## 2020-08-22 MED ORDER — FENTANYL CITRATE (PF) 100 MCG/2ML IJ SOLN
INTRAMUSCULAR | Status: AC
  Filled 2020-08-22: qty 2

## 2020-08-22 MED ORDER — NALBUPHINE HCL 10 MG/ML IJ SOLN: 5.0000 mg | INTRAMUSCULAR | Status: DC | PRN

## 2020-08-22 MED ORDER — ACETAMINOPHEN 500 MG PO TABS
1000.0000 mg | ORAL_TABLET | Freq: Four times a day (QID) | ORAL | Status: DC
  Administered 2020-08-22 – 2020-08-25 (×10): 1000 mg via ORAL
  Filled 2020-08-22 (×12): qty 2

## 2020-08-22 MED ORDER — LIDOCAINE HCL (PF) 2 % IJ SOLN
INTRAMUSCULAR | Status: AC
  Filled 2020-08-22: qty 5

## 2020-08-22 MED ORDER — SODIUM CHLORIDE 0.9 % IV SOLN
INTRAVENOUS | Status: DC | PRN
  Administered 2020-08-22: 50 ug/min via INTRAVENOUS

## 2020-08-22 MED ORDER — BUPIVACAINE LIPOSOME 1.3 % IJ SUSP
INTRAMUSCULAR | Status: DC | PRN
  Administered 2020-08-22: 50 mL
  Administered 2020-08-22: 100 mL

## 2020-08-22 MED ORDER — OXYTOCIN-SODIUM CHLORIDE 30-0.9 UT/500ML-% IV SOLN
INTRAVENOUS | Status: AC
  Filled 2020-08-22: qty 500

## 2020-08-22 MED ORDER — LIDOCAINE HCL (PF) 2 % IJ SOLN
INTRAMUSCULAR | Status: AC
  Filled 2020-08-22: qty 10

## 2020-08-22 MED ORDER — CEFAZOLIN SODIUM-DEXTROSE 2-3 GM-%(50ML) IV SOLR
INTRAVENOUS | Status: DC | PRN
Start: 2020-08-22 — End: 2020-08-22
  Administered 2020-08-22: 3 g via INTRAVENOUS

## 2020-08-22 MED ORDER — ENOXAPARIN SODIUM 40 MG/0.4ML IJ SOSY
40.0000 mg | PREFILLED_SYRINGE | INTRAMUSCULAR | Status: DC
  Filled 2020-08-22: qty 0.4

## 2020-08-22 MED ORDER — DEXAMETHASONE SODIUM PHOSPHATE 10 MG/ML IJ SOLN
INTRAMUSCULAR | Status: DC | PRN
  Administered 2020-08-22: 10 mg via INTRAVENOUS

## 2020-08-22 MED ORDER — OXYCODONE HCL 5 MG PO TABS
10.0000 mg | ORAL_TABLET | ORAL | Status: DC | PRN
  Administered 2020-08-24 – 2020-08-25 (×5): 10 mg via ORAL
  Filled 2020-08-22 (×4): qty 2

## 2020-08-22 MED ORDER — LIDOCAINE HCL (PF) 1 % IJ SOLN
INTRAMUSCULAR | Status: AC
  Filled 2020-08-22: qty 30

## 2020-08-22 MED ORDER — NALOXONE HCL 0.4 MG/ML IJ SOLN: 0.4000 mg | INTRAMUSCULAR | Status: DC | PRN

## 2020-08-22 MED ORDER — IBUPROFEN 600 MG PO TABS
600.0000 mg | ORAL_TABLET | Freq: Four times a day (QID) | ORAL | Status: DC
  Administered 2020-08-23 – 2020-08-25 (×8): 600 mg via ORAL
  Filled 2020-08-22 (×9): qty 1

## 2020-08-22 MED ORDER — AMMONIA AROMATIC IN INHA
RESPIRATORY_TRACT | Status: AC
  Filled 2020-08-22: qty 10

## 2020-08-22 MED ORDER — ONDANSETRON HCL 4 MG/2ML IJ SOLN
4.0000 mg | Freq: Three times a day (TID) | INTRAMUSCULAR | Status: DC | PRN
  Filled 2020-08-22: qty 2

## 2020-08-22 MED ORDER — ACETAMINOPHEN 325 MG PO TABS: 650.0000 mg | ORAL_TABLET | Freq: Four times a day (QID) | ORAL | Status: AC

## 2020-08-22 MED ORDER — DEXAMETHASONE SODIUM PHOSPHATE 10 MG/ML IJ SOLN
INTRAMUSCULAR | Status: AC
  Filled 2020-08-22: qty 1

## 2020-08-22 MED ORDER — NALOXONE HCL 4 MG/10ML IJ SOLN
1.0000 ug/kg/h | INTRAVENOUS | Status: DC | PRN
  Filled 2020-08-22: qty 5

## 2020-08-22 MED ORDER — OXYTOCIN-SODIUM CHLORIDE 30-0.9 UT/500ML-% IV SOLN: 2.5000 [IU]/h | INTRAVENOUS | Status: AC

## 2020-08-22 MED ORDER — FERROUS SULFATE 325 (65 FE) MG PO TABS
325.0000 mg | ORAL_TABLET | Freq: Two times a day (BID) | ORAL | Status: DC
  Administered 2020-08-22 – 2020-08-25 (×6): 325 mg via ORAL
  Filled 2020-08-22 (×6): qty 1

## 2020-08-22 MED ORDER — ONDANSETRON HCL 4 MG/2ML IJ SOLN
4.0000 mg | Freq: Four times a day (QID) | INTRAMUSCULAR | Status: DC | PRN
  Administered 2020-08-22: 4 mg via INTRAVENOUS
  Filled 2020-08-22: qty 2

## 2020-08-22 MED ORDER — TETANUS-DIPHTH-ACELL PERTUSSIS 5-2.5-18.5 LF-MCG/0.5 IM SUSY: 0.5000 mL | PREFILLED_SYRINGE | Freq: Once | INTRAMUSCULAR | Status: DC

## 2020-08-22 MED ORDER — CEFAZOLIN IN SODIUM CHLORIDE 3-0.9 GM/100ML-% IV SOLN
3.0000 g | INTRAVENOUS | Status: DC
  Filled 2020-08-22 (×2): qty 100

## 2020-08-22 MED ORDER — OXYCODONE HCL 5 MG PO TABS
5.0000 mg | ORAL_TABLET | ORAL | Status: DC | PRN
Start: 2020-08-22 — End: 2020-08-25
  Administered 2020-08-23 – 2020-08-24 (×4): 5 mg via ORAL
  Filled 2020-08-22 (×4): qty 1

## 2020-08-22 MED ORDER — OXYTOCIN-SODIUM CHLORIDE 30-0.9 UT/500ML-% IV SOLN
INTRAVENOUS | Status: DC | PRN
  Administered 2020-08-22: 30 [IU] via INTRAVENOUS

## 2020-08-22 MED ORDER — TRANEXAMIC ACID-NACL 1000-0.7 MG/100ML-% IV SOLN
1000.0000 mg | INTRAVENOUS | Status: AC
  Filled 2020-08-22: qty 100

## 2020-08-22 MED ORDER — KETOROLAC TROMETHAMINE 30 MG/ML IJ SOLN
30.0000 mg | Freq: Four times a day (QID) | INTRAMUSCULAR | Status: AC
  Administered 2020-08-22 – 2020-08-23 (×3): 30 mg via INTRAVENOUS
  Filled 2020-08-22: qty 1

## 2020-08-22 MED ORDER — ONDANSETRON HCL 4 MG/2ML IJ SOLN
INTRAMUSCULAR | Status: AC
  Filled 2020-08-22: qty 2

## 2020-08-22 MED ORDER — MENTHOL 3 MG MT LOZG
1.0000 | LOZENGE | OROMUCOSAL | Status: DC | PRN
  Filled 2020-08-22: qty 9

## 2020-08-22 MED ORDER — PHENYLEPHRINE HCL (PRESSORS) 10 MG/ML IV SOLN
INTRAVENOUS | Status: DC | PRN
  Administered 2020-08-22 (×2): 100 ug via INTRAVENOUS

## 2020-08-22 MED ORDER — SOD CITRATE-CITRIC ACID 500-334 MG/5ML PO SOLN
ORAL | Status: AC
  Administered 2020-08-22: 30 mL via ORAL
  Filled 2020-08-22: qty 15

## 2020-08-22 MED ORDER — FLEET ENEMA 7-19 GM/118ML RE ENEM: 1.0000 | ENEMA | Freq: Every day | RECTAL | Status: DC | PRN

## 2020-08-22 MED ORDER — DIPHENHYDRAMINE HCL 25 MG PO CAPS: 25.0000 mg | ORAL_CAPSULE | ORAL | Status: DC | PRN

## 2020-08-22 MED ORDER — MISOPROSTOL 200 MCG PO TABS
ORAL_TABLET | ORAL | Status: AC
  Filled 2020-08-22: qty 4

## 2020-08-22 MED ORDER — MEASLES, MUMPS & RUBELLA VAC IJ SOLR
0.5000 mL | Freq: Once | INTRAMUSCULAR | Status: DC
Start: 2020-08-23 — End: 2020-08-25
  Filled 2020-08-22: qty 0.5

## 2020-08-22 MED ORDER — OXYTOCIN 10 UNIT/ML IJ SOLN
INTRAMUSCULAR | Status: AC
  Filled 2020-08-22: qty 2

## 2020-08-22 MED ORDER — OXYCODONE HCL 5 MG PO TABS
5.0000 mg | ORAL_TABLET | ORAL | Status: DC | PRN
  Filled 2020-08-22: qty 2

## 2020-08-22 MED ORDER — MORPHINE SULFATE (PF) 0.5 MG/ML IJ SOLN
INTRAMUSCULAR | Status: AC
  Filled 2020-08-22: qty 10

## 2020-08-22 MED ORDER — BUPIVACAINE LIPOSOME 1.3 % IJ SUSP
INTRAMUSCULAR | Status: AC
  Filled 2020-08-22: qty 20

## 2020-08-22 MED ORDER — BUPIVACAINE HCL (PF) 0.5 % IJ SOLN
INTRAMUSCULAR | Status: AC
  Filled 2020-08-22: qty 30

## 2020-08-22 MED ORDER — DIPHENHYDRAMINE HCL 50 MG/ML IJ SOLN: 12.5000 mg | INTRAMUSCULAR | Status: DC | PRN

## 2020-08-22 MED ORDER — GABAPENTIN 300 MG PO CAPS
300.0000 mg | ORAL_CAPSULE | Freq: Every day | ORAL | Status: DC
  Administered 2020-08-22 – 2020-08-24 (×3): 300 mg via ORAL
  Filled 2020-08-22 (×3): qty 1

## 2020-08-22 MED ORDER — KETOROLAC TROMETHAMINE 30 MG/ML IJ SOLN: 30.0000 mg | Freq: Four times a day (QID) | INTRAMUSCULAR | Status: AC

## 2020-08-22 MED ORDER — SODIUM CHLORIDE 0.9% FLUSH: 3.0000 mL | INTRAVENOUS | Status: DC | PRN

## 2020-08-22 MED ORDER — BISACODYL 10 MG RE SUPP: 10.0000 mg | Freq: Every day | RECTAL | Status: DC | PRN

## 2020-08-22 MED ORDER — PRENATAL MULTIVITAMIN CH
1.0000 | ORAL_TABLET | Freq: Every day | ORAL | Status: DC
  Administered 2020-08-23 – 2020-08-25 (×3): 1 via ORAL
  Filled 2020-08-22 (×3): qty 1

## 2020-08-22 MED ORDER — NALBUPHINE HCL 10 MG/ML IJ SOLN: 5.0000 mg | Freq: Once | INTRAMUSCULAR | Status: DC | PRN

## 2020-08-22 MED ORDER — FENTANYL CITRATE (PF) 100 MCG/2ML IJ SOLN
INTRAMUSCULAR | Status: DC | PRN
  Administered 2020-08-22 (×2): 25 ug via INTRAVENOUS
  Administered 2020-08-22: 35 ug via INTRAVENOUS

## 2020-08-22 MED ORDER — POVIDONE-IODINE 10 % EX SWAB: 2.0000 "application " | Freq: Once | CUTANEOUS | Status: DC

## 2020-08-22 MED ORDER — DIBUCAINE (PERIANAL) 1 % EX OINT: 1.0000 "application " | TOPICAL_OINTMENT | CUTANEOUS | Status: DC | PRN

## 2020-08-22 MED ORDER — SIMETHICONE 80 MG PO CHEW
80.0000 mg | CHEWABLE_TABLET | Freq: Three times a day (TID) | ORAL | Status: DC
  Administered 2020-08-22 – 2020-08-25 (×9): 80 mg via ORAL
  Filled 2020-08-22 (×9): qty 1

## 2020-08-22 MED ORDER — FENTANYL CITRATE (PF) 100 MCG/2ML IJ SOLN
INTRAMUSCULAR | Status: DC | PRN
  Administered 2020-08-22: 15 ug via INTRAVENOUS

## 2020-08-22 MED ORDER — SOD CITRATE-CITRIC ACID 500-334 MG/5ML PO SOLN: 30.0000 mL | ORAL | Status: AC

## 2020-08-22 MED ORDER — KETOROLAC TROMETHAMINE 30 MG/ML IJ SOLN
30.0000 mg | Freq: Four times a day (QID) | INTRAMUSCULAR | Status: AC
  Administered 2020-08-23: 30 mg via INTRAVENOUS
  Filled 2020-08-22 (×3): qty 1

## 2020-08-22 MED ORDER — SODIUM CHLORIDE (PF) 0.9 % IJ SOLN
INTRAMUSCULAR | Status: AC
  Filled 2020-08-22: qty 50

## 2020-08-22 MED ORDER — NALBUPHINE HCL 10 MG/ML IJ SOLN
5.0000 mg | INTRAMUSCULAR | Status: DC | PRN
  Administered 2020-08-22: 5 mg via INTRAVENOUS
  Filled 2020-08-22: qty 1

## 2020-08-22 MED ORDER — ONDANSETRON HCL 4 MG/2ML IJ SOLN
INTRAMUSCULAR | Status: DC | PRN
  Administered 2020-08-22: 4 mg via INTRAVENOUS

## 2020-08-22 MED ORDER — EPHEDRINE 5 MG/ML INJ
INTRAVENOUS | Status: AC
  Filled 2020-08-22: qty 5

## 2020-08-22 MED ORDER — MEPERIDINE HCL 25 MG/ML IJ SOLN: 6.2500 mg | INTRAMUSCULAR | Status: DC | PRN

## 2020-08-22 MED ORDER — SIMETHICONE 80 MG PO CHEW
80.0000 mg | CHEWABLE_TABLET | ORAL | Status: DC | PRN
  Administered 2020-08-22 – 2020-08-23 (×2): 80 mg via ORAL
  Filled 2020-08-22 (×2): qty 1

## 2020-08-22 MED ORDER — DIPHENHYDRAMINE HCL 25 MG PO CAPS: 25.0000 mg | ORAL_CAPSULE | Freq: Four times a day (QID) | ORAL | Status: DC | PRN

## 2020-08-22 MED ORDER — BUPIVACAINE IN DEXTROSE 0.75-8.25 % IT SOLN
INTRATHECAL | Status: DC | PRN
  Administered 2020-08-22: 1.6 mL via INTRATHECAL

## 2020-08-22 MED ORDER — MORPHINE SULFATE (PF) 0.5 MG/ML IJ SOLN
INTRAMUSCULAR | Status: DC | PRN
  Administered 2020-08-22: .1 mg via INTRATHECAL

## 2020-08-22 MED ORDER — SENNOSIDES-DOCUSATE SODIUM 8.6-50 MG PO TABS
2.0000 | ORAL_TABLET | ORAL | Status: DC
  Administered 2020-08-22 – 2020-08-24 (×3): 2 via ORAL
  Filled 2020-08-22 (×4): qty 2

## 2020-08-22 MED ORDER — LIDOCAINE HCL (PF) 2 % IJ SOLN
INTRAMUSCULAR | Status: DC | PRN
  Administered 2020-08-22 (×3): 100 mg via EPIDURAL

## 2020-08-22 MED ORDER — CEPHALEXIN 500 MG PO CAPS
500.0000 mg | ORAL_CAPSULE | Freq: Two times a day (BID) | ORAL | Status: DC
  Administered 2020-08-22 – 2020-08-25 (×6): 500 mg via ORAL
  Filled 2020-08-22 (×7): qty 1

## 2020-08-22 SURGICAL SUPPLY — 39 items
BACTOSHIELD CHG 4% 4OZ (MISCELLANEOUS) ×1
CANISTER PREVENA 45 (CANNISTER) ×2 IMPLANT
CHLORAPREP W/TINT 26 (MISCELLANEOUS) ×2 IMPLANT
CONNECTOR PREVENA (MISCELLANEOUS) ×2 IMPLANT
CONTAINER UMBILICUP CORD BLD (MISCELLANEOUS) ×2 IMPLANT
DRESSING PEEL AND PLAC PRVNA20 (GAUZE/BANDAGES/DRESSINGS) ×1 IMPLANT
DRSG PEEL AND PLACE PREVENA 20 (GAUZE/BANDAGES/DRESSINGS) ×2 IMPLANT
DRSG TELFA 3X8 NADH (GAUZE/BANDAGES/DRESSINGS) ×2 IMPLANT
ELECT REM PT RETURN 9FT ADLT (ELECTROSURGICAL) ×2 IMPLANT
ELECTRODE REM PT RTRN 9FT ADLT (ELECTROSURGICAL) ×1 IMPLANT
EXTRT SYSTEM ALEXIS 17CM (MISCELLANEOUS) ×2 IMPLANT
GAUZE SPONGE 4X4 12PLY STRL (GAUZE/BANDAGES/DRESSINGS) ×2 IMPLANT
GOWN STRL REUS W/ TWL LRG LVL3 (GOWN DISPOSABLE) ×3 IMPLANT
GOWN STRL REUS W/TWL LRG LVL3 (GOWN DISPOSABLE) ×3
MANIFOLD NEPTUNE II (INSTRUMENTS) ×2 IMPLANT
MAT PREVALON FULL STRYKER (MISCELLANEOUS) ×2 IMPLANT
NEEDLE HYPO 25GX1X1/2 BEV (NEEDLE) ×2 IMPLANT
NS IRRIG 1000ML POUR BTL (IV SOLUTION) ×2 IMPLANT
PACK C SECTION AR (MISCELLANEOUS) ×2 IMPLANT
PAD OB MATERNITY 4.3X12.25 (PERSONAL CARE ITEMS) ×2 IMPLANT
PAD PREP 24X41 OB/GYN DISP (PERSONAL CARE ITEMS) ×2 IMPLANT
PENCIL SMOKE EVACUATOR (MISCELLANEOUS) ×2 IMPLANT
RETRACTOR TRAXI PANNICULUS (MISCELLANEOUS) ×1 IMPLANT
SCRUB CHG 4% DYNA-HEX 4OZ (MISCELLANEOUS) ×1 IMPLANT
STAPLER INSORB 30 2030 C-SECTI (MISCELLANEOUS) ×2 IMPLANT
SUT MNCRL 4-0 (SUTURE) ×1
SUT MNCRL 4-0 27 PS-2 XMFL (SUTURE) ×1 IMPLANT
SUT MNCRL 4-0 27XMFL (SUTURE) ×1
SUT PDS AB 1 TP1 96 (SUTURE) ×2 IMPLANT
SUT VIC AB 0 CT1 36 (SUTURE) ×4 IMPLANT
SUT VIC AB 0 CTX 36 (SUTURE) ×2
SUT VIC AB 0 CTX36XBRD ANBCTRL (SUTURE) ×2 IMPLANT
SUT VIC AB 2-0 SH 27 (SUTURE) ×5
SUT VIC AB 2-0 SH 27XBRD (SUTURE) ×5 IMPLANT
SUTURE MNCRL 4-0 27XMF (SUTURE) ×1 IMPLANT
SYR 30ML LL (SYRINGE) ×4 IMPLANT
SYSTEM CONTND EXTRCTN KII BLLN (MISCELLANEOUS) ×1 IMPLANT
TRAXI PANNICULUS RETRACTOR (MISCELLANEOUS) ×1
UMBILICUP CORD BLD COLLECTION (MISCELLANEOUS) ×4 IMPLANT

## 2020-08-22 NOTE — Anesthesia Preprocedure Evaluation (Signed)
Anesthesia Evaluation  Patient identified by MRN, date of birth, ID band Patient awake    Reviewed: Allergy & Precautions, NPO status , Patient's Chart, lab work & pertinent test results  History of Anesthesia Complications Negative for: history of anesthetic complications  Airway Mallampati: II  TM Distance: >3 FB Neck ROM: Full    Dental no notable dental hx.    Pulmonary neg sleep apnea, neg COPD, former smoker,    breath sounds clear to auscultation- rhonchi (-) wheezing      Cardiovascular Exercise Tolerance: Good (-) hypertension(-) CAD, (-) Past MI, (-) Cardiac Stents and (-) CABG  Rhythm:Regular Rate:Normal - Systolic murmurs and - Diastolic murmurs    Neuro/Psych neg Seizures negative neurological ROS  negative psych ROS   GI/Hepatic Neg liver ROS, hiatal hernia,   Endo/Other  diabetes, Gestational  Renal/GU negative Renal ROS     Musculoskeletal negative musculoskeletal ROS (+)   Abdominal (+) + obese,   Peds  Hematology negative hematology ROS (+)   Anesthesia Other Findings Past Medical History: No date: Gestational diabetes No date: History of hiatal hernia   Reproductive/Obstetrics (+) Pregnancy                             Lab Results  Component Value Date   WBC 9.5 08/21/2020   HGB 12.9 08/21/2020   HCT 36.1 08/21/2020   MCV 89.4 08/21/2020   PLT 192 08/21/2020    Anesthesia Physical Anesthesia Plan  ASA: 3  Anesthesia Plan: Combined Spinal and Epidural   Post-op Pain Management:    Induction:   PONV Risk Score and Plan: 2 and Ondansetron  Airway Management Planned: Natural Airway  Additional Equipment:   Intra-op Plan:   Post-operative Plan:   Informed Consent: I have reviewed the patients History and Physical, chart, labs and discussed the procedure including the risks, benefits and alternatives for the proposed anesthesia with the patient or  authorized representative who has indicated his/her understanding and acceptance.     Dental advisory given  Plan Discussed with: CRNA and Anesthesiologist  Anesthesia Plan Comments:         Anesthesia Quick Evaluation

## 2020-08-22 NOTE — Transfer of Care (Signed)
Immediate Anesthesia Transfer of Care Note  Patient: Catherine Lewis  Procedure(s) Performed: REPEAT CESAREAN SECTION WITH BILATERAL TUBAL LIGATION (Bilateral)  Patient Location: Mother/Baby  Anesthesia Type:Spinal  Level of Consciousness: awake, alert  and oriented  Airway & Oxygen Therapy: Patient Spontanous Breathing  Post-op Assessment: Report given to RN and Post -op Vital signs reviewed and stable  Post vital signs: Reviewed  Last Vitals:  Vitals Value Taken Time  BP 133/75 08/22/20 1108  Temp 36.6 C 08/22/20 1108  Pulse 77 08/22/20 1108  Resp 18 08/22/20 1108  SpO2 98 % 08/22/20 1108    Last Pain:  Vitals:   08/22/20 0710  TempSrc:   PainSc: 0-No pain         Complications: No notable events documented.

## 2020-08-22 NOTE — Op Note (Addendum)
Cesarean Section Procedure Note  Date of procedure: 08/22/2020   Pre-operative Diagnosis: Intrauterine pregnancy at [redacted]w[redacted]d;  PreE without severe features gDMA2 on total 70U insulin daily Didi-twins GBS positive Prior LTCS with macrosomia Obesity: Body mass index is 56.98 kg/m.  Post-operative Diagnosis: same, delivered. **Modified 22 for difficult case due to habitus  Procedure: Repeat Low Transverse Cesarean Section through Pfannenstiel incision, bilateral tubal sterilization  Surgeon: Christeen Douglas, MD  Assistant(s):  Jennell Corner, MD - Heloise Ochoa, CNM for assistance. Two skilled assistants required for safety in the case.  Anesthesia: Epidural anesthesia and Monitored Local Anesthesia with Sedation  Anesthesiologist: No responsible provider has been recorded for the case. Anesthesiologist: Alver Fisher, MD CRNA: Elmarie Mainland, CRNA; Mathews Argyle, CRNA  Estimated Blood Loss:   795         Drains: Foley Cath  Dressings: Provena wound vac placed         Total IV Fluids:  Urine Output: 54ml         Specimens: portion of right and left tubes         Complications:  None; patient tolerated the procedure well.         Disposition: PACU - hemodynamically stable.         Condition: stable  Findings: Baby A:   A female infant in cephalic presentation. Amniotic fluid - Clear  Birth weight 2895 g.  Apgars of 7 and 8 at one and five minutes respectively.   Baby B:   A female infant in cephalic presentation. Amniotic fluid - Clear  Birth weight 3460 g.  Apgars of 5 and 8 at one and five minutes respectively.   Intact placenta with a three-vessel cord.  Grossly normal uterus, tubes and ovaries bilaterally. Minimal intraabdominal adhesions were noted.  Case was difficult due to pannus positioning. Traxi used.  Indications:  See above  Procedure Details  The patient was taken to Operating Room, identified as the correct patient and the  procedure verified as C-Section Delivery. A formal Time Out was held with all team members present and in agreement.  After induction of anesthesia, the patient was draped and prepped in the usual sterile manner. A Traxi pannus retractor was placed, and an X of tape used to lift the pannus to the shoulders. A Pfannenstiel skin incision was made and carried down through the subcutaneous tissue to the fascia. Fascial incision was made and extended transversely with the Mayo scissors. The fascia was separated from the underlying rectus tissue superiorly and inferiorly. The peritoneum was identified and entered bluntly. Peritoneal incision was extended longitudinally. The utero-vesical peritoneal reflection was incised transversely and a bladder flap was created digitally.   A low transverse hysterotomy was made. The amniotic sac was ruptured for fluid as above. Baby A was delivered atraumatically. The umbilical cord was clamped x2 and cut and the infant was handed to the awaiting pediatricians. The second amniotic sac was ruptured. Baby B was brought to the hysterotomy, and the amniotic sac ruptured. Baby B was delivered atraumatically. The cord was doubly clamped, cut and Baby B was passed off to awaiting NICU staff.  The placenta was removed intact and appeared normal, intact, and with two 3-vessel cords. Baby A's cord was marked with a single clamp, and Baby's cord was marked with double clamps.   The uterus was exteriorized and cleared of all clot and debris. The hysterotomy was closed with running sutures of 0-Vicryl. A second imbricating layer was placed with  the same suture. Excellent hemostasis was observed.   Attention was then turned to the tubal ligation. The right fallopian tube distinguished from the round ligament by identifying the fimbria and was grasped with a Babcock clamp in the midisthmic portion approximately 3 cm from the cornual region. It was then doubly ligated with 2-0 Vicryl suture  in a salpingectomy fashion. The tubal segment was excised with Metzenbaum scissors. Tubal ostea noted. The procedure was repeated on the left side. *Care was noted to examine both tubal sites in situ to ensure the sutures were intact and no bleeding was noted. The uterus was returned to the abdomen.  The peritoneal cavity was cleared of all clots and debris. The bilateral tubal ligation sites were visualized.  The pelvis was irrigated and again, excellent hemostasis was noted. The fascia was then reapproximated with running sutures of 0 Vicryl. Gloves were changed. The subcutaneous tissue was reapproximated with running sutures of 0 Vicry. The skin was reapproximated with Ensorb absorbable sutures.  34ml (in 30 of 0.5% bupivicaine and 74ml of NSS) of liposomal bupivicaine placed in the fascial and skin lines.  Provena wound vac placed using sterile technique.  Instrument, sponge, and needle counts were correct prior to the abdominal closure and at the conclusion of the case.   The patient tolerated the procedure well and was transferred to the recovery room in stable condition.   Christeen Douglas, MD 08/22/2020

## 2020-08-22 NOTE — Discharge Summary (Signed)
Obstetrical Discharge Summary  Patient Name: Catherine Lewis DOB: [birth year] MRN: [id number]  Date of Admission: 08/22/2020 Date of Delivery: 08/22/20 Delivered by: Dalbert Garnet MD Date of Discharge: 08/25/2020  Primary OB: [facility]  CBJ:SEGBTDV'V last menstrual period was 12/09/2019. EDC Estimated Date of Delivery: 09/14/20 Gestational Age at Delivery: [redacted]w[redacted]d   Antepartum complications:  Obesity - Pre-pregnancy BMI 51.25, currently 13.85 Di/Di twins A2GDM- well controlled on insulin qAM-30N/12R; qPM 14N/14R GBS bacteruria Previous LTCS with macrosomia History of heart murmur as child History of Gastric Sleeve  Admitting Diagnosis: Pre-E without severe features, 36wks. Di-di twins Secondary Diagnosis: Patient Active Problem List   Diagnosis Date Noted   Cesarean delivery delivered 08/25/2020   Elevated blood pressure affecting pregnancy in third trimester, antepartum 08/21/2020   Dichorionic diamniotic twin pregnancy in third trimester 07/23/2020    Augmentation: AROM Complications: None Intrapartum complications/course: see Op note Date of Delivery: 08/22/20 Delivered By: Dalbert Garnet MD Delivery Type: repeat cesarean section, low transverse incision Anesthesia: spinal Placenta: manual Laceration: none Episiotomy: none Newborn Data:   Kyndell, Zeiser [[id number]]  Live born female  Birth Weight: 6 lb 6.1 oz (2895 g) APGAR: 7, 8  Newborn Delivery   Birth date/time: 08/22/2020 09:37:00 Delivery type: C-Section, Low Transverse Trial of labor: No C-section categorization: Repeat       Laelyn, Blumenthal [[id number]]  Live born female  Birth Weight: 7 lb 10.1 oz (3460 g) APGAR: 5, 8  Newborn Delivery   Birth date/time: 08/22/2020 09:40:00 Delivery type: C-Section, Low Transverse Trial of labor: No C-section categorization: Repeat      Postpartum Procedures: none  Edinburgh:  Edinburgh Postnatal Depression Scale Screening Tool 08/23/2020 08/23/2020 08/22/2020   I have been able to laugh and see the funny side of things. 0 (No Data) (No Data)  I have looked forward with enjoyment to things. 0 - -  I have blamed myself unnecessarily when things went wrong. 0 - -  I have been anxious or worried for no good reason. 0 - -  I have felt scared or panicky for no good reason. 0 - -  Things have been getting on top of me. 1 - -  I have been so unhappy that I have had difficulty sleeping. 0 - -  I have felt sad or miserable. 0 - -  I have been so unhappy that I have been crying. 0 - -  The thought of harming myself has occurred to me. 0 - -  Edinburgh Postnatal Depression Scale Total 1 - -      Post partum course:Cesarean Section:  Patient had an uncomplicated postpartum course.  By time of discharge on POD#3, her pain was controlled on oral pain medications; she had appropriate lochia and was ambulating, voiding without difficulty, tolerating regular diet and passing flatus.   She was deemed stable for discharge to home.    Discharge Physical Exam:  BP (!) 138/95 (BP Location: Right Arm)   Pulse 76   Temp 97.7 F (36.5 C) (Oral)   Resp 18   Ht 5\' 6"  (1.676 m)   Wt (!) 160.1 kg   LMP 12/09/2019   SpO2 100%   Breastfeeding Unknown   BMI 56.98 kg/m   General: NAD CV: RRR Pulm: CTABL, nl effort ABD: s/nd/nt, fundus firm and below the umbilicus Lochia: moderate Incision: wound vac in place, no erythema DVT Evaluation: LE non-ttp, no evidence of DVT on exam.  Hemoglobin  Date Value Ref Range Status  08/23/2020 10.6 (  L) 12.0 - 15.0 g/dL Final   HCT  Date Value Ref Range Status  08/23/2020 30.4 (L) 36.0 - 46.0 % Final     Disposition: stable, discharge to home. Baby Feeding: breastmilk Baby Disposition: home with mom  Rh Immune globulin given: n/a Rubella vaccine given: immune Varicella vaccine given: immune Tdap vaccine given in AP or PP setting: given 06/23/20 Flu vaccine given in AP or PP setting: Received  11/02/2019  Contraception: sterilization, bilateral salpingectomy performed.   Prenatal Labs:  Blood type/Rh A Pos  Antibody screen neg  Rubella Immune  Varicella Immune  RPR NR  HBsAg Neg  HIV NR  GC neg  Chlamydia neg  Genetic screening negative  1 hour GTT  156  3 hour GTT  104, 188, 154, 51  GBS  POS     Plan:  Catherine Lewis was discharged to home in good condition. Follow-up appointment with delivering provider in 1 weeks.  Discharge Medications: Allergies as of 08/25/2020   No Known Allergies      Medication List     STOP taking these medications    folic acid 1 MG tablet Commonly known as: FOLVITE   insulin regular 100 units/mL injection Commonly known as: NOVOLIN R   NovoLIN N 100 UNIT/ML injection Generic drug: insulin NPH Human       TAKE these medications    acetaminophen 500 MG tablet Commonly known as: TYLENOL Take 2 tablets (1,000 mg total) by mouth every 6 (six) hours.   bisacodyl 10 MG suppository Commonly known as: DULCOLAX Place 1 suppository (10 mg total) rectally daily as needed for moderate constipation.   cephALEXin 500 MG capsule Commonly known as: KEFLEX Take 1 capsule (500 mg total) by mouth every 12 (twelve) hours for 5 days.   coconut oil Oil Apply 1 application topically as needed.   ibuprofen 600 MG tablet Commonly known as: ADVIL Take 1 tablet (600 mg total) by mouth every 6 (six) hours.   multivitamin-prenatal 27-0.8 MG Tabs tablet Take 1 tablet by mouth daily at 12 noon.   oxyCODONE 5 MG immediate release tablet Commonly known as: Oxy IR/ROXICODONE Take 1 tablet (5 mg total) by mouth every 6 (six) hours as needed for up to 7 days for moderate pain.   senna-docusate 8.6-50 MG tablet Commonly known as: Senokot-S Take 2 tablets by mouth daily.   simethicone 80 MG chewable tablet Commonly known as: MYLICON Chew 1 tablet (80 mg total) by mouth 3 (three) times daily after meals.         Follow-up  Information     Christeen Douglas, MD Follow up in 1 week(s).   Specialty: Obstetrics and Gynecology Why: post-op visit Contact information: [address] [place] [address] [phone]                 Signed:  Cyril Mourning, CNM 08/25/2020  8:43 AM

## 2020-08-22 NOTE — Anesthesia Procedure Notes (Signed)
Epidural Patient location during procedure: OB  Staffing Anesthesiologist: Alver Fisher, MD Performed: anesthesiologist   Preanesthetic Checklist Completed: patient identified, IV checked, site marked, risks and benefits discussed, surgical consent, monitors and equipment checked, pre-op evaluation and timeout performed  Epidural Patient position: sitting Prep: ChloraPrep Patient monitoring: heart rate, continuous pulse ox and blood pressure Approach: midline Location: L3-L4 Injection technique: LOR saline  Needle:  Needle type: Tuohy  Needle gauge: 18 G Needle length: 9 cm and 9 Needle insertion depth: 9 cm Catheter type: closed end flexible Catheter size: 20 Guage Catheter at skin depth: 13 cm Test dose: negative  Assessment Events: blood not aspirated, injection not painful, no injection resistance, no paresthesia and negative IV test  Additional Notes CSE performed with needle through needle technique  Patient tolerated the insertion well without complications.Reason for block:procedure for pain

## 2020-08-23 LAB — CBC
HCT: 30.4 % — ABNORMAL LOW (ref 36.0–46.0)
Hemoglobin: 10.6 g/dL — ABNORMAL LOW (ref 12.0–15.0)
MCH: 31.8 pg (ref 26.0–34.0)
MCHC: 34.9 g/dL (ref 30.0–36.0)
MCV: 91.3 fL (ref 80.0–100.0)
Platelets: 164 10*3/uL (ref 150–400)
RBC: 3.33 MIL/uL — ABNORMAL LOW (ref 3.87–5.11)
RDW: 13.9 % (ref 11.5–15.5)
WBC: 10.7 10*3/uL — ABNORMAL HIGH (ref 4.0–10.5)
nRBC: 0 % (ref 0.0–0.2)

## 2020-08-23 LAB — SURGICAL PATHOLOGY

## 2020-08-23 NOTE — Anesthesia Post-op Follow-up Note (Signed)
  Anesthesia Pain Follow-up Note  Patient: Catherine Lewis  Day #: 1  Date of Follow-up: 08/23/2020 Time: 9:07 AM  Last Vitals:  Vitals:   08/23/20 0500 08/23/20 0801  BP:  138/66  Pulse: 69 67  Resp:  18  Temp:  36.6 C  SpO2: 97% 100%    Level of Consciousness: alert  Pain: none   Side Effects:None  Catheter Site Exam:clean, dry, no drainage     Plan: D/C from anesthesia care at surgeon's request  Elmarie Mainland

## 2020-08-23 NOTE — Progress Notes (Signed)
Post Partum Day 1  Subjective: Doing well, no concerns. Ambulating without difficulty, pain managed with PO meds, tolerating regular diet, and voiding without difficulty.   No fever/chills, chest pain, shortness of breath, nausea/vomiting, or leg pain. No nipple or breast pain. No headache, visual changes, or RUQ/epigastric pain.  Objective: BP 138/66 (BP Location: Right Arm)   Pulse 67   Temp 97.9 F (36.6 C) (Oral)   Resp 18   Ht 5\' 6"  (1.676 m)   Wt (!) 160.1 kg   LMP 12/09/2019   SpO2 100% Comment: Room Air  Breastfeeding Unknown   BMI 56.98 kg/m    Physical Exam:  General: alert and cooperative Breasts: soft/nontender CV: RRR Pulm: nl effort Abdomen: soft, non-tender Uterine Fundus: firm Incision: healing well Perineum: intact Lochia: appropriate DVT Evaluation: No evidence of DVT seen on physical exam. Edinburgh:  Edinburgh Postnatal Depression Scale Screening Tool 08/23/2020 08/22/2020  I have been able to laugh and see the funny side of things. (No Data) (No Data)     Recent Labs    08/21/20 1451 08/23/20 0629  HGB 12.9 10.6*  HCT 36.1 30.4*  WBC 9.5 10.7*  PLT 192 164    Assessment/Plan: 35 y.o. G2P1103 postpartum day # 1  -Continue routine postpartum care -Lactation consult PRN for breastfeeding   -Acute blood loss anemia - hemodynamically stable and asymptomatic; start PO ferrous sulfate BID with stool softeners  -Immunization status: all immunizations up to date  Disposition: Continue inpatient postpartum care    LOS: 1 day    , CNM 08/23/2020, 12:48 PM

## 2020-08-23 NOTE — Anesthesia Postprocedure Evaluation (Signed)
Anesthesia Post Note  Patient: Catherine Lewis  Procedure(s) Performed: REPEAT CESAREAN SECTION WITH BILATERAL TUBAL LIGATION (Bilateral)  Patient location during evaluation: Mother Baby Anesthesia Type: Spinal Level of consciousness: oriented and awake and alert Pain management: pain level controlled Vital Signs Assessment: post-procedure vital signs reviewed and stable Respiratory status: spontaneous breathing and respiratory function stable Cardiovascular status: blood pressure returned to baseline and stable Postop Assessment: no headache, no backache, no apparent nausea or vomiting and able to ambulate Anesthetic complications: no   No notable events documented.   Last Vitals:  Vitals:   08/23/20 0500 08/23/20 0801  BP:  138/66  Pulse: 69 67  Resp:  18  Temp:  36.6 C  SpO2: 97% 100%    Last Pain:  Vitals:   08/23/20 0801  TempSrc: Oral  PainSc:                  Elmarie Mainland

## 2020-08-24 ENCOUNTER — Other Ambulatory Visit: Payer: 59

## 2020-08-24 MED ORDER — COCONUT OIL OIL: 1.0000 "application " | TOPICAL_OIL | 0 refills | Status: DC | PRN

## 2020-08-24 MED ORDER — BISACODYL 10 MG RE SUPP: 10.0000 mg | Freq: Every day | RECTAL | 0 refills | Status: DC | PRN

## 2020-08-24 MED ORDER — IBUPROFEN 600 MG PO TABS: 600.0000 mg | ORAL_TABLET | Freq: Four times a day (QID) | ORAL | 0 refills | Status: DC

## 2020-08-24 MED ORDER — CEPHALEXIN 500 MG PO CAPS: 500.0000 mg | ORAL_CAPSULE | Freq: Two times a day (BID) | ORAL | 0 refills | Status: AC

## 2020-08-24 MED ORDER — ACETAMINOPHEN 500 MG PO TABS: 1000.0000 mg | ORAL_TABLET | Freq: Four times a day (QID) | ORAL | 0 refills | Status: DC

## 2020-08-24 MED ORDER — OXYCODONE HCL 5 MG PO TABS: 5.0000 mg | ORAL_TABLET | Freq: Four times a day (QID) | ORAL | 0 refills | Status: AC | PRN

## 2020-08-24 MED ORDER — SENNOSIDES-DOCUSATE SODIUM 8.6-50 MG PO TABS: 2.0000 | ORAL_TABLET | ORAL | 0 refills | Status: DC

## 2020-08-24 MED ORDER — SIMETHICONE 80 MG PO CHEW: 80.0000 mg | CHEWABLE_TABLET | Freq: Three times a day (TID) | ORAL | 0 refills | Status: DC

## 2020-08-24 MED ORDER — ENOXAPARIN SODIUM 80 MG/0.8ML IJ SOSY
0.5000 mg/kg | PREFILLED_SYRINGE | INTRAMUSCULAR | Status: DC
  Administered 2020-08-24 – 2020-08-25 (×2): 80 mg via SUBCUTANEOUS
  Filled 2020-08-24 (×2): qty 0.8

## 2020-08-24 NOTE — Progress Notes (Signed)
PHARMACIST - PHYSICIAN COMMUNICATION  CONCERNING:  Enoxaparin (Lovenox) for DVT Prophylaxis    RECOMMENDATION: Patient was prescribed enoxaprin 40mg  q24 hours for VTE prophylaxis.   Filed Weights   08/22/20 0625  Weight: (!) 160.1 kg (353 lb)    Body mass index is 56.98 kg/m.  Estimated Creatinine Clearance: 154.3 mL/min (by C-G formula based on SCr of 0.56 mg/dL).   Based on [facility] policy patient is candidate for enoxaparin 0.5mg /kg TBW SQ every 24 hours based on BMI being >30.   DESCRIPTION: Pharmacy has adjusted enoxaparin dose per [facility] policy.  Patient is now receiving enoxaparin 80 mg every 24 hours    [facility], PharmD Pharmacy Resident  08/24/2020 8:53 AM

## 2020-08-25 ENCOUNTER — Inpatient Hospital Stay: Admit: 2020-08-25 | Payer: 59

## 2020-08-25 ENCOUNTER — Other Ambulatory Visit: Payer: 59

## 2020-08-25 NOTE — Lactation Note (Signed)
Mom pumping breasts, on LC rounds, states supply increasing, estimate 20-30 cc EBM, has pumps at home, mom given Breastfeeding Resource sheets, has support for home, for d/c with babies today.

## 2020-08-25 NOTE — Progress Notes (Signed)
Pt discharged with infant. Discharge instructions, prescriptions, and follow up appointments given to and reviewed with patient. Pt verbalized understanding. Pt refused wheelchair, ambulated to car. Florencia Reasons, RN

## 2020-08-28 ENCOUNTER — Other Ambulatory Visit: Admission: RE | Admit: 2020-08-28 | Payer: 59 | Source: Ambulatory Visit

## 2021-03-01 ENCOUNTER — Encounter (HOSPITAL_COMMUNITY): Payer: Self-pay | Admitting: *Deleted

## 2021-04-02 ENCOUNTER — Other Ambulatory Visit: Payer: Self-pay | Admitting: General Surgery

## 2021-04-02 DIAGNOSIS — R1111 Vomiting without nausea: Secondary | ICD-10-CM

## 2021-04-02 DIAGNOSIS — Z8632 Personal history of gestational diabetes: Secondary | ICD-10-CM

## 2021-04-02 DIAGNOSIS — Z9884 Bariatric surgery status: Secondary | ICD-10-CM

## 2021-05-01 ENCOUNTER — Inpatient Hospital Stay: Admission: RE | Admit: 2021-05-01 | Payer: 59 | Source: Ambulatory Visit

## 2021-05-02 ENCOUNTER — Inpatient Hospital Stay: Admission: RE | Admit: 2021-05-02 | Payer: 59 | Source: Ambulatory Visit

## 2021-05-03 ENCOUNTER — Ambulatory Visit
Admission: RE | Admit: 2021-05-03 | Discharge: 2021-05-03 | Disposition: A | Discharge status: Home / Self Care | Payer: Managed Care, Other (non HMO) | Source: Ambulatory Visit | Attending: General Surgery | Admitting: General Surgery

## 2021-05-03 DIAGNOSIS — Z8632 Personal history of gestational diabetes: Secondary | ICD-10-CM

## 2021-05-03 DIAGNOSIS — R1111 Vomiting without nausea: Secondary | ICD-10-CM

## 2021-05-03 DIAGNOSIS — Z9884 Bariatric surgery status: Secondary | ICD-10-CM

## 2022-02-05 ENCOUNTER — Telehealth: Payer: Self-pay | Admitting: Obstetrics and Gynecology

## 2022-02-05 NOTE — Telephone Encounter (Signed)
PC to [facility] to let her know that the bill balance from [employer] for the Panorama screening in her prior pregnancy has been cleared.  Per our [employer], the balance is now $0.  Wilburt Finlay, MS, CGC

## 2022-08-21 ENCOUNTER — Other Ambulatory Visit: Payer: Self-pay | Admitting: Obstetrics and Gynecology

## 2022-09-03 ENCOUNTER — Other Ambulatory Visit: Payer: Managed Care, Other (non HMO)

## 2022-09-03 NOTE — H&P (Signed)
Catherine Lewis is a 37 y.o. female here for Pre Op Consulting (Sign consents) .   History of Present Illness: Patient presents for a preoperative visit to schedule a D&C, hysteroscopy with IUD placement for management of her heavy inconsistent but profound bleeding and AUB.       TVUS 07/2022:    The sonogram reveals an enlarged anteverted uterus.   The endometrium is thickened measuring  33.35mm.   Rt simple ovarian cyst measuring 36mm as detailed above.   There is no free fluid.   Pertinent Hx: - C/S x 2 - one for twin delivery  - Last pap 01/2020 neg/neg   Past Medical History:  has a past medical history of Bleeding disorder (CMS-HCC) and Obesity (BMI 30-39.9), unspecified.  Past Surgical History:  has a past surgical history that includes Cesarean section; Laparoscopic Gastroplasty Non-Vertical Banding For Obesity (07/2016); Bariatric Surgery (07/2017); and Tubal ligation. Family History: family history includes Diabetes in her paternal grandmother; Melanoma in her father. Social History:  reports that she has quit smoking. She has never used smokeless tobacco. She reports that she does not currently use alcohol. She reports that she does not use drugs. OB/GYN History:  OB History       Gravida 2   Para 2   Term 1   Preterm 1   AB     Living 3       SAB     IAB     Ectopic     Molar     Multiple 1   Live Births 3          Allergies: has No Known Allergies. Medications:  Current Medications   Current Outpatient Medications:    norethindrone (AYGESTIN) 5 mg tablet, Take 1 tablet (5 mg total) by mouth 3 (three) times daily for 5 days, THEN 1 tablet (5 mg total) 2 (two) times daily for 5 days, THEN 1 tablet (5 mg total) once daily for 7 days., Disp: 32 tablet, Rfl: 0   sertraline (ZOLOFT) 100 MG tablet, Take 2 tablets by mouth once daily (200mg ), Disp: 180 tablet, Rfl: 3   tranexamic acid (LYSTEDA) 650 mg tablet, Take 2 tablets (1,300 mg  total) by mouth 3 (three) times daily Take for a maximum of 5 days during monthly menstruation., Disp: 30 tablet, Rfl: 3   acetaminophen (TYLENOL) 500 MG tablet, Take by mouth (Patient not taking: Reported on 03/28/2021), Disp: , Rfl:    BD VEO INSULIN SYRINGE UF 0.3 mL 31 gauge x 15/64" Syrg, , Disp: , Rfl:    BD VEO INSULIN SYRINGE UF 1/2 mL 31 gauge x 15/64" Syrg, as directed (Patient not taking: Reported on 09/14/2020), Disp: , Rfl:    bisacodyL (DULCOLAX) 10 mg suppository, Place rectally (Patient not taking: Reported on 09/14/2020), Disp: , Rfl:    blood glucose diagnostic test strip, 1 each (1 strip total) 4 (four) times daily Use as instructed. (Patient not taking: Reported on 09/29/2020), Disp: 120 each, Rfl: 12   coconut oil, bulk, Oil, Apply topically (Patient not taking: Reported on 08/28/2020), Disp: , Rfl:    folic acid (FOLVITE) 1 MG tablet, Take 1 tablet (1 mg total) by mouth once daily (Patient not taking: No sig reported), Disp: 30 tablet, Rfl: 11   FUROsemide (LASIX) 20 MG tablet, Take 1 tablet (20 mg total) by mouth once daily (Patient not taking: Reported on 09/29/2020), Disp: 21 tablet, Rfl: 0   ibuprofen (MOTRIN) 600 MG tablet, Take 600 mg  by mouth every 6 (six) hours (Patient not taking: Reported on 03/28/2021), Disp: , Rfl:    insulin NPH (HUMULIN N) injection (concentration 100 units/mL), 30 units qAM and 11 units qPM subcutaneous (Patient not taking: Reported on 08/28/2020), Disp: 10 mL, Rfl: 12   insulin REGULAR (NOVOLIN R REGULAR U-100 INSULN) injection (concentration 100 units/mL), INJECT 12 UNITS UNDER THE SKIN IN AM, 14 UNITS UNDER THE SKIN EVERY PM (Patient not taking: Reported on 08/28/2020), Disp: 10 mL, Rfl: 12   labetaloL (TRANDATE) 300 MG tablet, TAKE 1 TABLET BY MOUTH 2 TIMES DAILY. (Patient not taking: Reported on 03/28/2021), Disp: 180 tablet, Rfl: 0   labetaloL (TRANDATE) 300 MG tablet, Take 300 mg by mouth 2 (two) times daily (Patient not taking: Reported on 03/04/2022), Disp: ,  Rfl:    lancets, Use 1 each 4 (four) times daily Use as instructed. (Patient not taking: No sig reported), Disp: 120 each, Rfl: 12   meloxicam (MOBIC) 15 MG tablet, Take 15 mg by mouth once daily (Patient not taking: Reported on 07/03/2022), Disp: , Rfl:    pen needle, diabetic 32 gauge x 5/16" needle, Use as directed (Patient not taking: Reported on 08/28/2020), Disp: 100 each, Rfl: 12   prenatal vitamin with iron-folic acid (PRENATAL TABLETS) tablet, Take 1 tablet by mouth once daily (Patient not taking: Reported on 03/28/2021), Disp: , Rfl:    semaglutide (WEGOVY) 2.4 mg/0.75 mL pen injector, Inject 0.75 mLs (2.4 mg total) subcutaneously once a week for 84 days, Disp: 3 mL, Rfl: 2   semaglutide (WEGOVY) 2.4 mg/0.75 mL pen injector, Inject 0.75 mLs (2.4 mg total) subcutaneously once a week for 84 days, Disp: 3 mL, Rfl: 2   sennosides-docusate (SENOKOT-S) 8.6-50 mg tablet, Take 2 tablets by mouth once daily (Patient not taking: Reported on 09/29/2020), Disp: , Rfl:    sertraline (ZOLOFT) 50 MG tablet, Take 1.5 tablets (75 mg total) by mouth once daily (Patient not taking: Reported on 07/03/2022), Disp: 135 tablet, Rfl: 3   simethicone (MYLICON) 80 MG chewable tablet, Take by mouth (Patient not taking: Reported on 09/29/2020), Disp: , Rfl:     Review of Systems: No SOB, no palpitations or chest pain, no new lower extremity edema, no nausea or vomiting or bowel or bladder complaints. See HPI for gyn specific ROS.    Exam:   BP 132/88   Ht 167.6 cm (5\' 6" )   Wt (!) 152.7 kg (336 lb 9.6 oz)   BMI 54.33 kg/m    General: Patient is well-groomed, well-nourished, appears stated age in no acute distress   HEENT: head is atraumatic and normocephalic, trachea is midline, neck is supple with no palpable nodules   CV: Regular rhythm and normal heart rate, no murmur   Pulm: Clear to auscultation throughout lung fields with no wheezing, crackles, or rhonchi. No increased work of breathing   Abdomen: soft , no  mass, non-tender, no rebound tenderness, no hepatomegaly     Impression:   The encounter diagnosis was Abnormal uterine bleeding (AUB).   Plan:   1.  Preoperative visit: D&C hysteroscopy, IUD placement. Consents signed today.  -Risks of surgery were discussed with the patient including but not limited to: bleeding which may require transfusion; infection which may require antibiotics; injury to uterus or surrounding organs; intrauterine scarring which may impair future fertility; need for additional procedures including laparotomy or laparoscopy; and other postoperative/anesthesia complications. Written informed consent was obtained.   This is a scheduled same-day surgery. She will have  a postop visit in 2 weeks to review operative findings and pathology.   We will consider RATLH if no improvement and no malignant findings.   Return for Postop check.

## 2022-09-04 ENCOUNTER — Other Ambulatory Visit: Payer: Self-pay

## 2022-09-04 ENCOUNTER — Encounter
Admission: RE | Admit: 2022-09-04 | Discharge: 2022-09-04 | Disposition: A | Discharge status: Home / Self Care | Payer: Managed Care, Other (non HMO) | Source: Ambulatory Visit | Attending: Obstetrics and Gynecology | Admitting: Obstetrics and Gynecology

## 2022-09-04 DIAGNOSIS — Z01812 Encounter for preprocedural laboratory examination: Secondary | ICD-10-CM

## 2022-09-04 HISTORY — DX: Unspecified ovarian cyst, unspecified side: N83.209

## 2022-09-04 HISTORY — DX: Anemia, unspecified: D64.9

## 2022-09-04 HISTORY — DX: Morbid (severe) obesity due to excess calories: E66.01

## 2022-09-04 HISTORY — DX: Anxiety disorder, unspecified: F41.9

## 2022-09-04 NOTE — Patient Instructions (Addendum)
Your procedure is scheduled on: 09/12/22 - Thursday Report to the Registration Desk on the 1st floor of the [facility]. To find out your arrival time, please call [phone] between 1PM - 3PM on: 09/11/22 - Wednesday If your arrival time is 6:00 am, do not arrive before that time as the [facility] entrance doors do not open until 6:00 am.  REMEMBER: Instructions that are not followed completely may result in serious medical risk, up to and including death; or upon the discretion of your surgeon and anesthesiologist your surgery may need to be rescheduled.  Do not eat food after midnight the night before surgery.  No gum chewing or hard candies.  You may however, drink CLEAR liquids up to 2 hours before you are scheduled to arrive for your surgery. Do not drink anything within 2 hours of your scheduled arrival time.  Clear liquids include: - water  - apple juice without pulp - gatorade (not RED colors) - black coffee or tea (Do NOT add milk or creamers to the coffee or tea) Do NOT drink anything that is not on this list.   One week prior to surgery: Stop Anti-inflammatories (NSAIDS) such as Advil, Aleve, Ibuprofen, Motrin, Naproxen, Naprosyn and Aspirin based products such as Excedrin, Goody's Powder, BC Powder.  Stop ANY OVER THE COUNTER supplements until after surgery.  You may however, continue to take Tylenol if needed for pain up until the day of surgery.   TAKE ONLY THESE MEDICATIONS THE MORNING OF SURGERY WITH A SIP OF WATER:  sertraline (ZOLOFT)    No Alcohol for 24 hours before or after surgery.  No Smoking including e-cigarettes for 24 hours before surgery.  No chewable tobacco products for at least 6 hours before surgery.  No nicotine patches on the day of surgery.  Do not use any "recreational" drugs for at least a week (preferably 2 weeks) before your surgery.  Please be advised that the combination of cocaine and anesthesia may have negative outcomes, up  to and including death. If you test positive for cocaine, your surgery will be cancelled.  On the morning of surgery brush your teeth with toothpaste and water, you may rinse your mouth with mouthwash if you wish. Do not swallow any toothpaste or mouthwash.  Use CHG Soap or wipes as directed on instruction sheet.  Do not wear jewelry, make-up, hairpins, clips or nail polish.  Do not wear lotions, powders, or perfumes.   Do not shave body hair from the neck down 48 hours before surgery.  Contact lenses, hearing aids and dentures may not be worn into surgery.  Do not bring valuables to the hospital. [facility] is not responsible for any missing/lost belongings or valuables.   Notify your doctor if there is any change in your medical condition (cold, fever, infection).  Wear comfortable clothing (specific to your surgery type) to the hospital.  After surgery, you can help prevent lung complications by doing breathing exercises.  Take deep breaths and cough every 1-2 hours. Your doctor may order a device called an Incentive Spirometer to help you take deep breaths. When coughing or sneezing, hold a pillow firmly against your incision with both hands. This is called "splinting." Doing this helps protect your incision. It also decreases belly discomfort.  If you are being admitted to the hospital overnight, leave your suitcase in the car. After surgery it may be brought to your room.  In case of increased patient census, it may be necessary for  you, the patient, to continue your postoperative care in the Same Day Surgery department.  If you are being discharged the day of surgery, you will not be allowed to drive home. You will need a responsible individual to drive you home and stay with you for 24 hours after surgery.   If you are taking public transportation, you will need to have a responsible individual with you.  Please call the Pre-admissions Testing Dept. at [phone] if  you have any questions about these instructions.  Surgery Visitation Policy:  Patients having surgery or a procedure may have two visitors.  Children under the age of 61 must have an adult with them who is not the patient.  Inpatient Visitation:    Visiting hours are 7 a.m. to 8 p.m. Up to four visitors are allowed at one time in a patient room. The visitors may rotate out with other people during the day.  One visitor age 67 or older may stay with the patient overnight and must be in the room by 8 p.m.

## 2022-09-05 ENCOUNTER — Encounter
Admission: RE | Admit: 2022-09-05 | Discharge: 2022-09-05 | Disposition: A | Discharge status: Home / Self Care | Payer: Managed Care, Other (non HMO) | Source: Ambulatory Visit | Attending: Obstetrics and Gynecology | Admitting: Obstetrics and Gynecology

## 2022-09-05 DIAGNOSIS — Z01812 Encounter for preprocedural laboratory examination: Secondary | ICD-10-CM | POA: Insufficient documentation

## 2022-09-05 DIAGNOSIS — N921 Excessive and frequent menstruation with irregular cycle: Secondary | ICD-10-CM | POA: Insufficient documentation

## 2022-09-05 LAB — TYPE AND SCREEN
ABO/RH(D): O POS
Antibody Screen: NEGATIVE

## 2022-09-05 LAB — BASIC METABOLIC PANEL
Anion gap: 10 (ref 5–15)
BUN: 12 mg/dL (ref 6–20)
CO2: 23 mmol/L (ref 22–32)
Calcium: 9.4 mg/dL (ref 8.9–10.3)
Chloride: 106 mmol/L (ref 98–111)
Creatinine, Ser: 0.81 mg/dL (ref 0.44–1.00)
GFR, Estimated: >60 mL/min (ref >60)
Glucose, Bld: 94 mg/dL (ref 70–99)
Potassium: 3.8 mmol/L (ref 3.5–5.1)
Sodium: 139 mmol/L (ref 135–145)

## 2022-09-05 LAB — CBC
HCT: 35.9 % — ABNORMAL LOW (ref 36.0–46.0)
Hemoglobin: 11.1 g/dL — ABNORMAL LOW (ref 12.0–15.0)
MCH: 29 pg (ref 26.0–34.0)
MCHC: 30.9 g/dL (ref 30.0–36.0)
MCV: 93.7 fL (ref 80.0–100.0)
Platelets: 341 10*3/uL (ref 150–400)
RBC: 3.83 MIL/uL — ABNORMAL LOW (ref 3.87–5.11)
RDW: 15.1 % (ref 11.5–15.5)
WBC: 6.3 10*3/uL (ref 4.0–10.5)
nRBC: 0 % (ref 0.0–0.2)

## 2022-09-11 MED ORDER — LACTATED RINGERS IV SOLN: INTRAVENOUS | Status: DC

## 2022-09-11 MED ORDER — ACETAMINOPHEN 500 MG PO TABS
1000.0000 mg | ORAL_TABLET | ORAL | Status: AC
  Administered 2022-09-12: 1000 mg via ORAL

## 2022-09-11 MED ORDER — FAMOTIDINE 20 MG PO TABS
20.0000 mg | ORAL_TABLET | Freq: Once | ORAL | Status: AC
  Administered 2022-09-12: 20 mg via ORAL

## 2022-09-11 MED ORDER — CHLORHEXIDINE GLUCONATE 0.12 % MT SOLN
15.0000 mL | Freq: Once | OROMUCOSAL | Status: AC
  Administered 2022-09-12: 15 mL via OROMUCOSAL

## 2022-09-11 MED ORDER — POVIDONE-IODINE 10 % EX SWAB: 2.0000 | Freq: Once | CUTANEOUS | Status: DC

## 2022-09-11 MED ORDER — ORAL CARE MOUTH RINSE: 15.0000 mL | Freq: Once | OROMUCOSAL | Status: AC

## 2022-09-12 ENCOUNTER — Encounter: Payer: Self-pay | Admitting: Obstetrics and Gynecology

## 2022-09-12 ENCOUNTER — Other Ambulatory Visit: Payer: Self-pay

## 2022-09-12 ENCOUNTER — Ambulatory Visit: Payer: Managed Care, Other (non HMO) | Admitting: Anesthesiology

## 2022-09-12 ENCOUNTER — Ambulatory Visit
Admission: RE | Admit: 2022-09-12 | Discharge: 2022-09-12 | Disposition: A | Discharge status: Home / Self Care | Payer: Managed Care, Other (non HMO) | Attending: Obstetrics and Gynecology | Admitting: Obstetrics and Gynecology

## 2022-09-12 ENCOUNTER — Encounter
Admission: RE | Disposition: A | Discharge status: Home / Self Care | Payer: Self-pay | Source: Home / Self Care | Attending: Obstetrics and Gynecology

## 2022-09-12 DIAGNOSIS — R509 Fever, unspecified: Secondary | ICD-10-CM | POA: Insufficient documentation

## 2022-09-12 DIAGNOSIS — Z7985 Long-term (current) use of injectable non-insulin antidiabetic drugs: Secondary | ICD-10-CM | POA: Diagnosis not present

## 2022-09-12 DIAGNOSIS — N921 Excessive and frequent menstruation with irregular cycle: Secondary | ICD-10-CM

## 2022-09-12 DIAGNOSIS — Z01812 Encounter for preprocedural laboratory examination: Secondary | ICD-10-CM

## 2022-09-12 DIAGNOSIS — E119 Type 2 diabetes mellitus without complications: Secondary | ICD-10-CM | POA: Diagnosis not present

## 2022-09-12 DIAGNOSIS — N841 Polyp of cervix uteri: Secondary | ICD-10-CM | POA: Insufficient documentation

## 2022-09-12 DIAGNOSIS — Z9884 Bariatric surgery status: Secondary | ICD-10-CM | POA: Insufficient documentation

## 2022-09-12 DIAGNOSIS — N920 Excessive and frequent menstruation with regular cycle: Secondary | ICD-10-CM | POA: Insufficient documentation

## 2022-09-12 DIAGNOSIS — Z6841 Body Mass Index (BMI) 40.0 and over, adult: Secondary | ICD-10-CM | POA: Insufficient documentation

## 2022-09-12 DIAGNOSIS — Z87891 Personal history of nicotine dependence: Secondary | ICD-10-CM | POA: Diagnosis not present

## 2022-09-12 HISTORY — PX: INTRAUTERINE DEVICE (IUD) INSERTION: SHX5877

## 2022-09-12 HISTORY — PX: HYSTEROSCOPY WITH D & C: SHX1775

## 2022-09-12 LAB — POCT PREGNANCY, URINE: Preg Test, Ur: NEGATIVE

## 2022-09-12 SURGERY — DILATATION AND CURETTAGE /HYSTEROSCOPY
Anesthesia: General

## 2022-09-12 MED ORDER — DEXAMETHASONE SODIUM PHOSPHATE 10 MG/ML IJ SOLN
INTRAMUSCULAR | Status: DC | PRN
  Administered 2022-09-12: 10 mg via INTRAVENOUS

## 2022-09-12 MED ORDER — IBUPROFEN 800 MG PO TABS
800.0000 mg | ORAL_TABLET | Freq: Three times a day (TID) | ORAL | 1 refills | Status: AC
Start: 2022-09-12 — End: 2022-09-15

## 2022-09-12 MED ORDER — OXYCODONE HCL 5 MG/5ML PO SOLN: 5.0000 mg | Freq: Once | ORAL | Status: DC | PRN

## 2022-09-12 MED ORDER — SUGAMMADEX SODIUM 500 MG/5ML IV SOLN
INTRAVENOUS | Status: DC | PRN
  Administered 2022-09-12: 200 mg via INTRAVENOUS

## 2022-09-12 MED ORDER — LIDOCAINE HCL (CARDIAC) PF 100 MG/5ML IV SOSY
PREFILLED_SYRINGE | INTRAVENOUS | Status: DC | PRN
  Administered 2022-09-12: 100 mg via INTRAVENOUS

## 2022-09-12 MED ORDER — LEVONORGESTREL 20 MCG/DAY IU IUD
INTRAUTERINE_SYSTEM | INTRAUTERINE | Status: AC
  Filled 2022-09-12: qty 1

## 2022-09-12 MED ORDER — FENTANYL CITRATE (PF) 100 MCG/2ML IJ SOLN
INTRAMUSCULAR | Status: AC
  Filled 2022-09-12: qty 2

## 2022-09-12 MED ORDER — ACETAMINOPHEN 500 MG PO TABS
ORAL_TABLET | ORAL | Status: AC
  Filled 2022-09-12: qty 2

## 2022-09-12 MED ORDER — ONDANSETRON HCL 4 MG/2ML IJ SOLN
INTRAMUSCULAR | Status: DC | PRN
  Administered 2022-09-12: 4 mg via INTRAVENOUS

## 2022-09-12 MED ORDER — MIDAZOLAM HCL 2 MG/2ML IJ SOLN
INTRAMUSCULAR | Status: DC | PRN
  Administered 2022-09-12: 2 mg via INTRAVENOUS

## 2022-09-12 MED ORDER — DOCUSATE SODIUM 100 MG PO CAPS: 100.0000 mg | ORAL_CAPSULE | Freq: Every day | ORAL | 2 refills | Status: DC | PRN

## 2022-09-12 MED ORDER — MIDAZOLAM HCL 2 MG/2ML IJ SOLN
INTRAMUSCULAR | Status: AC
  Filled 2022-09-12: qty 2

## 2022-09-12 MED ORDER — SUCCINYLCHOLINE CHLORIDE 200 MG/10ML IV SOSY
PREFILLED_SYRINGE | INTRAVENOUS | Status: DC | PRN
  Administered 2022-09-12: 200 mg via INTRAVENOUS
  Administered 2022-09-12: 50 mg via INTRAVENOUS

## 2022-09-12 MED ORDER — EPHEDRINE SULFATE (PRESSORS) 50 MG/ML IJ SOLN
INTRAMUSCULAR | Status: DC | PRN
  Administered 2022-09-12: 5 mg via INTRAVENOUS

## 2022-09-12 MED ORDER — DEXMEDETOMIDINE HCL IN NACL 80 MCG/20ML IV SOLN
INTRAVENOUS | Status: DC | PRN
  Administered 2022-09-12: 4 ug via INTRAVENOUS
  Administered 2022-09-12: 8 ug via INTRAVENOUS
  Administered 2022-09-12: 4 ug via INTRAVENOUS
  Administered 2022-09-12 (×3): 8 ug via INTRAVENOUS

## 2022-09-12 MED ORDER — PROPOFOL 1000 MG/100ML IV EMUL
INTRAVENOUS | Status: AC
  Filled 2022-09-12: qty 100

## 2022-09-12 MED ORDER — FENTANYL CITRATE (PF) 100 MCG/2ML IJ SOLN
INTRAMUSCULAR | Status: DC | PRN
  Administered 2022-09-12 (×2): 50 ug via INTRAVENOUS

## 2022-09-12 MED ORDER — PROPOFOL 10 MG/ML IV BOLUS
INTRAVENOUS | Status: DC | PRN
  Administered 2022-09-12: 200 mg via INTRAVENOUS

## 2022-09-12 MED ORDER — FENTANYL CITRATE (PF) 100 MCG/2ML IJ SOLN: 25.0000 ug | INTRAMUSCULAR | Status: DC | PRN

## 2022-09-12 MED ORDER — OXYTOCIN 10 UNIT/ML IJ SOLN
INTRAMUSCULAR | Status: AC
  Filled 2022-09-12: qty 2

## 2022-09-12 MED ORDER — OXYCODONE HCL 5 MG PO TABS: 5.0000 mg | ORAL_TABLET | Freq: Once | ORAL | Status: DC | PRN

## 2022-09-12 MED ORDER — CHLORHEXIDINE GLUCONATE 0.12 % MT SOLN
OROMUCOSAL | Status: AC
  Filled 2022-09-12: qty 15

## 2022-09-12 MED ORDER — PROPOFOL 10 MG/ML IV BOLUS
INTRAVENOUS | Status: AC
  Filled 2022-09-12: qty 40

## 2022-09-12 MED ORDER — LACTATED RINGERS IV SOLN: INTRAVENOUS | Status: DC | PRN

## 2022-09-12 MED ORDER — FAMOTIDINE 20 MG PO TABS
ORAL_TABLET | ORAL | Status: AC
  Filled 2022-09-12: qty 1

## 2022-09-12 MED ORDER — ROCURONIUM BROMIDE 100 MG/10ML IV SOLN
INTRAVENOUS | Status: DC | PRN
  Administered 2022-09-12: 20 mg via INTRAVENOUS

## 2022-09-12 SURGICAL SUPPLY — 28 items
BAG PRESSURE INF REUSE 1000 (BAG) ×1 IMPLANT
BASIN KIT SINGLE STR (MISCELLANEOUS) ×1 IMPLANT
DEVICE MYOSURE LITE (MISCELLANEOUS) IMPLANT
DEVICE MYOSURE REACH (MISCELLANEOUS) IMPLANT
DRSG TELFA 3X8 NADH STRL (GAUZE/BANDAGES/DRESSINGS) IMPLANT
ELECT REM PT RETURN 9FT ADLT (ELECTROSURGICAL) ×1 IMPLANT
ELECTRODE REM PT RTRN 9FT ADLT (ELECTROSURGICAL) ×1 IMPLANT
GLOVE BIO SURGEON STRL SZ7 (GLOVE) ×1 IMPLANT
GLOVE INDICATOR 7.5 STRL GRN (GLOVE) ×1 IMPLANT
GOWN STRL REUS W/ TWL LRG LVL3 (GOWN DISPOSABLE) ×2 IMPLANT
GOWN STRL REUS W/TWL LRG LVL3 (GOWN DISPOSABLE) ×2
IV NS IRRIG 3000ML ARTHROMATIC (IV SOLUTION) ×1 IMPLANT
KIT PROCEDURE FLUENT (KITS) ×1 IMPLANT
KIT TURNOVER CYSTO (KITS) ×1 IMPLANT
MANIFOLD NEPTUNE II (INSTRUMENTS) ×1 IMPLANT
PACK DNC HYST (MISCELLANEOUS) ×1 IMPLANT
PAD PREP OB/GYN DISP 24X41 (PERSONAL CARE ITEMS) ×1 IMPLANT
SCRUB CHG 4% DYNA-HEX 4OZ (MISCELLANEOUS) ×1 IMPLANT
SEAL ROD LENS SCOPE MYOSURE (ABLATOR) IMPLANT
SET CYSTO W/LG BORE CLAMP LF (SET/KITS/TRAYS/PACK) IMPLANT
SOL PREP PVP 2OZ (MISCELLANEOUS) ×1 IMPLANT
SOLUTION PREP PVP 2OZ (MISCELLANEOUS) ×1 IMPLANT
SYS MIRENA INTRAUTERINE (MISCELLANEOUS) ×1 IMPLANT
SYSTEM MIRENA INTRAUTERINE (MISCELLANEOUS) IMPLANT
TOWEL OR 17X26 4PK STRL BLUE (TOWEL DISPOSABLE) ×1 IMPLANT
TRAP FLUID SMOKE EVACUATOR (MISCELLANEOUS) ×1 IMPLANT
TUBING CONNECTING 10 (TUBING) ×1 IMPLANT
WATER STERILE IRR 500ML POUR (IV SOLUTION) ×1 IMPLANT

## 2022-09-12 NOTE — Op Note (Addendum)
Operative Report Hysteroscopy with Dilation and Curettage   Indications: Heavy vaginal bleeding  Pre-operative Diagnosis: Menorrhagia  Post-operative Diagnosis: same. Of note, the patient was febrile throughout the case to 37.7, and had dropped to 37.0 at the conclusion of the case.  Procedure: 1. Exam under anesthesia 2. Fractional D&C 3. Hysteroscopy 4.  IUD placement  Surgeon: Christeen Douglas, MD  Assistant(s):  None  Anesthesia: General LMA anesthesia  Anesthesiologist: Yevette Edwards, MD Anesthesiologist: Yevette Edwards, MD; Piscitello, Cleda Mccreedy, MD CRNA: Merlene Pulling, CRNA  Estimated Blood Loss:  less than 50 mL         Total IV Fluids: 1000 ml  Urine Output: 40 ml  Total Fluid Deficit: 0 mL -the recorded amount was -40, but this appeared to be blood loss.         Specimens: Endometrial curettings         Complications:  None; patient tolerated the procedure well.         Disposition: PACU - hemodynamically stable.         Condition: stable  Findings: Uterus measuring 12 cm by sound; normal cervix, vagina, perineum.  The endometrial cavity was difficult to be fully assessed, as her bleeding was significantly heavy throughout the procedure.  There was an excess of fluffy proliferative tissue.  Both tubal ostia were able to be visualized.  No discrete polyps or fibroids were noted.  This case was exceptionally difficult based on visualization and angles, and qualifies for modifier 22.  Indication for procedure/Consents: 37 y.o. [id number]  here for scheduled surgery for the aforementioned diagnoses.     Risks of surgery were discussed with the patient including but not limited to: bleeding which may require transfusion; infection which may require antibiotics; injury to uterus or surrounding organs; intrauterine scarring which may impair future fertility; need for additional procedures including laparotomy or laparoscopy; and other postoperative/anesthesia  complications. Written informed consent was obtained.    Procedure Details:   D&C/Polypectomy The patient was taken to the operating room where anesthesia was administered and was found to be adequate.  After a formal and adequate timeout was performed, she was placed in the dorsal lithotomy position and examined with the above findings. She was then prepped and draped in the sterile manner.   Her bladder was catheterized for an estimated amount of clear, yellow urine. A weighed speculum was then placed in the patient's vagina and a single tooth tenaculum was applied to the anterior lip of the cervix.  This was done blindly by feel, as it was difficult to find the cervix.  However, using Brisky retractors and a long weighted speculum and Deaver, we did find the cervix.   Her cervix was serially dilated to 15 [place] using Hanks dilators.Her uterus was sounded to 12 cm. The hysteroscope was introduced to reveal the above findings.  A sharp curettage was then performed until there was a gritty texture in all four quadrants.  This was not entirely successful, as I was unable to reach all fundus of the uterus.  The most difficult place to reach was just inside the internal cervical os, however, as the angles were too acute at this portion of the uterus.  The Mirena IUD was placed at 12 cm at the fundus, using a uterine packing forcep to find the appropriate canal.  The strings were cut to 5 cm, to assist in visualization at her postop visit.  The tenaculum was removed from the anterior lip of the  cervix and the vaginal speculum was removed after confirming cervical hemostasis.  The patient tolerated the procedure well and was taken to the recovery area awake and in stable condition. She received iv acetaminophen and Toradol prior to leaving the OR.  The patient will be discharged to home as per PACU criteria. Routine postoperative instructions given.  She was prescribed Ibuprofen and Colace.  She will  follow up in the clinic in two weeks for postoperative evaluation.

## 2022-09-12 NOTE — Anesthesia Postprocedure Evaluation (Signed)
Anesthesia Post Note  Patient: [name]  Procedure(s) Performed: DILATATION AND CURETTAGE /HYSTEROSCOPY INTRAUTERINE DEVICE (IUD) INSERTION, MIRENA  Patient location during evaluation: PACU Anesthesia Type: General Level of consciousness: awake and alert Pain management: pain level controlled Vital Signs Assessment: post-procedure vital signs reviewed and stable Respiratory status: spontaneous breathing, nonlabored ventilation, respiratory function stable and patient connected to nasal cannula oxygen Cardiovascular status: blood pressure returned to baseline and stable Postop Assessment: no apparent nausea or vomiting Anesthetic complications: no   No notable events documented.   Last Vitals:  Vitals:   09/12/22 0945 09/12/22 1000  BP: (!) 152/95 (!) 144/88  Pulse: 72 69  Resp: 19 17  Temp: 36.7 C   SpO2: 97% 96%    Last Pain:  Vitals:   09/12/22 0945  TempSrc:   PainSc: 0-No pain                 Catherine Lewis

## 2022-09-12 NOTE — Interval H&P Note (Signed)
History and Physical Interval Note:  09/12/2022 7:23 AM  Catherine Lewis  has presented today for surgery, with the diagnosis of AUB.  The various methods of treatment have been discussed with the patient and family. After consideration of risks, benefits and other options for treatment, the patient has consented to  Procedure(s): DILATATION AND CURETTAGE /HYSTEROSCOPY (N/A) INTRAUTERINE DEVICE (IUD) INSERTION, MIRENA (N/A) as a surgical intervention.  [name] history has been reviewed, patient examined, no change in status, stable for surgery.  I have reviewed the patient's chart and labs.  Questions were answered to the patient's satisfaction.     Christeen Douglas

## 2022-09-12 NOTE — Discharge Instructions (Addendum)
Discharge instructions after a hysteroscopy with dilation and curettage  Signs and Symptoms to Report  Call our office at [phone] if you have any of the following:    Fever over 100.4 degrees or higher  Severe stomach pain not relieved with pain medications  Bright red bleeding that's heavier than a period that does not slow with rest after the first 24 hours  To go the bathroom a lot (frequency), you can't hold your urine (urgency), or it hurts when you empty your bladder (urinate)  Chest pain  Shortness of breath  Pain in the calves of your legs  Severe nausea and vomiting not relieved with anti-nausea medications  Any concerns  What You Can Expect after Surgery  You may see some pink tinged, bloody fluid. This is normal. You may also have cramping for several days.   Activities after Your Discharge Follow these guidelines to help speed your recovery at home:  Don't drive if you are in pain or taking narcotic pain medicine. You may drive when you can safely slam on the brakes, turn the wheel forcefully, and rotate your torso comfortably. This is typically 4-7 days. Practice in a parking lot or side street prior to attempting to drive regularly.   Ask others to help with household chores for 4 weeks.  Don't do strenuous activities, exercises, or sports like vacuuming, tennis, squash, etc. until your doctor says it is safe to do so.  Walk as you feel able. Rest often since it may take a week or two for your energy level to return to normal.   You may climb stairs  Avoid constipation:   -Eat fruits, vegetables, and whole grains. Eat small meals as your appetite will take time to return to normal.   -Drink 6 to 8 glasses of water each day unless your doctor has told you to limit your fluids.   -Use a laxative or stool softener as needed if constipation becomes a problem. You may take Miralax, metamucil, Citrucil, Colace, Senekot, FiberCon, etc. If this does not relieve the  constipation, try two tablespoons of Milk Of Magnesia every 8 hours until your bowels move.   You may shower.   Do not get in a hot tub, swimming pool, etc. until your doctor agrees.  Do not douche, use tampons, or have sex until your doctor says it is okay, usually about 2 weeks.  Take your pain medicine when you need it. The medicine may not work as well if the pain is bad.  Take the medicines you were taking before surgery. Other medications you might need are pain medications (ibuprofen), medications for constipation (Colace) and nausea medications (Zofran).      AMBULATORY SURGERY  DISCHARGE INSTRUCTIONS   The drugs that you were given will stay in your system until tomorrow so for the next 24 hours you should not:  Drive an automobile Make any legal decisions Drink any alcoholic beverage   You may resume regular meals tomorrow.  Today it is better to start with liquids and gradually work up to solid foods.  You may eat anything you prefer, but it is better to start with liquids, then soup and crackers, and gradually work up to solid foods.   Please notify your doctor immediately if you have any unusual bleeding, trouble breathing, redness and pain at the surgery site, drainage, fever, or pain not relieved by medication.    Additional Instructions:        Please contact your physician   with any problems or Same Day Surgery at [phone], Monday through Friday 6 am to 4 pm, or [place] [facility] [place] [facility] number at [phone]. 

## 2022-09-12 NOTE — OR Nursing (Signed)
Intrauterine Mirena placed per Dr. Dalbert Garnet in OR Time: [phone]

## 2022-09-12 NOTE — Transfer of Care (Signed)
Immediate Anesthesia Transfer of Care Note  Patient: Catherine Lewis  Procedure(s) Performed: DILATATION AND CURETTAGE /HYSTEROSCOPY INTRAUTERINE DEVICE (IUD) INSERTION, MIRENA  Patient Location: PACU  Anesthesia Type:General  Level of Consciousness: drowsy  Airway & Oxygen Therapy: Patient Spontanous Breathing and Patient connected to face mask oxygen  Post-op Assessment: Report given to RN and Post -op Vital signs reviewed and stable  Post vital signs: Reviewed  Last Vitals:  Vitals Value Taken Time  BP 130/79 09/12/22 0930  Temp 37C   Pulse 74 09/12/22 0931  Resp 22 09/12/22 0931  SpO2 100 % 09/12/22 0931  Vitals shown include unfiled device data.  Last Pain:  Vitals:   09/12/22 0650  TempSrc: Temporal  PainSc: 0-No pain         Complications: No notable events documented.

## 2022-09-12 NOTE — Anesthesia Preprocedure Evaluation (Signed)
Anesthesia Evaluation  Patient identified by MRN, date of birth, ID band Patient awake    Reviewed: Allergy & Precautions, NPO status , Patient's Chart, lab work & pertinent test results  History of Anesthesia Complications Negative for: history of anesthetic complications  Airway Mallampati: III  TM Distance: <3 FB Neck ROM: full    Dental  (+) Chipped   Pulmonary neg shortness of breath, former smoker   Pulmonary exam normal        Cardiovascular Exercise Tolerance: Good (-) angina negative cardio ROS Normal cardiovascular exam     Neuro/Psych  PSYCHIATRIC DISORDERS      negative neurological ROS     GI/Hepatic Neg liver ROS, hiatal hernia,neg GERD  ,,  Endo/Other  diabetes  Morbid obesity  Renal/GU      Musculoskeletal   Abdominal   Peds  Hematology negative hematology ROS (+)   Anesthesia Other Findings Past Medical History: No date: Anemia No date: Anxiety No date: Gestational diabetes No date: History of hiatal hernia No date: Ovarian cyst No date: Severe obesity [facility])  Past Surgical History: 2007: CESAREAN SECTION 08/22/2020: CESAREAN SECTION WITH BILATERAL TUBAL LIGATION; Bilateral     Comment:  Procedure: REPEAT CESAREAN SECTION WITH BILATERAL TUBAL               LIGATION;  Surgeon: Christeen Douglas, MD;  Location: [facility]              ORS;  Service: Obstetrics;  Laterality: Bilateral; 10/08/2019: FASCIECTOMY; Left     Comment:  Procedure: PARTIAL PLANTAR FASCIECTOMY WITH HEEL               RESECTION OF LEFT FOOT;  Surgeon: Linus Galas, DPM;                Location: [facility] ORS;  Service: Podiatry;  Laterality:               Left; 08/06/2016: LAPAROSCOPIC GASTRIC BANDING WITH HIATAL HERNIA REPAIR; N/A     Comment:  Procedure: LAPAROSCOPIC GASTRIC BANDING WITH HIATAL               HERNIA REPAIR;  Surgeon: Gaynelle Adu, MD;  Location: [facility]               ORS;  Service: General;  Laterality: N/A;  BMI     Body Mass Index: 54.07 kg/m      Reproductive/Obstetrics negative OB ROS                             Anesthesia Physical Anesthesia Plan  ASA: 3  Anesthesia Plan: General ETT   Post-op Pain Management:    Induction: Intravenous  PONV Risk Score and Plan: Ondansetron, Dexamethasone, Midazolam and Treatment may vary due to age or medical condition  Airway Management Planned: Oral ETT and Video Laryngoscope Planned  Additional Equipment:   Intra-op Plan:   Post-operative Plan: Extubation in OR  Informed Consent: I have reviewed the patients History and Physical, chart, labs and discussed the procedure including the risks, benefits and alternatives for the proposed anesthesia with the patient or authorized representative who has indicated his/her understanding and acceptance.     Dental Advisory Given  Plan Discussed with: Anesthesiologist, CRNA and Surgeon  Anesthesia Plan Comments: (Patient consented for risks of anesthesia including but not limited to:  - adverse reactions to medications - damage to eyes, teeth, lips or other oral mucosa - nerve damage due  to positioning  - sore throat or hoarseness - Damage to heart, brain, nerves, lungs, other parts of body or loss of life  Patient voiced understanding.)       Anesthesia Quick Evaluation

## 2022-09-12 NOTE — Anesthesia Procedure Notes (Signed)
Procedure Name: Intubation Date/Time: 09/12/2022 7:52 AM  Performed by: Merlene Pulling, CRNAPre-anesthesia Checklist: Patient identified, Patient being monitored, Timeout performed, Emergency Drugs available and Suction available Patient Re-evaluated:Patient Re-evaluated prior to induction Oxygen Delivery Method: Circle system utilized Preoxygenation: Pre-oxygenation with 100% oxygen Induction Type: IV induction Ventilation: Mask ventilation without difficulty Laryngoscope Size: 3 and McGraph Grade View: Grade I Tube type: Oral Tube size: 7.0 mm Number of attempts: 1 Airway Equipment and Method: Stylet Placement Confirmation: ETT inserted through vocal cords under direct vision, positive ETCO2 and breath sounds checked- equal and bilateral Secured at: 22 cm Tube secured with: Tape Dental Injury: Teeth and Oropharynx as per pre-operative assessment

## 2023-05-27 ENCOUNTER — Emergency Department

## 2023-05-27 ENCOUNTER — Emergency Department
Admission: EM | Admit: 2023-05-27 | Discharge: 2023-05-27 | Disposition: A | Discharge status: Home / Self Care | Attending: Emergency Medicine | Admitting: Emergency Medicine

## 2023-05-27 ENCOUNTER — Other Ambulatory Visit: Payer: Self-pay

## 2023-05-27 DIAGNOSIS — I8002 Phlebitis and thrombophlebitis of superficial vessels of left lower extremity: Secondary | ICD-10-CM | POA: Insufficient documentation

## 2023-05-27 DIAGNOSIS — I83892 Varicose veins of left lower extremities with other complications: Secondary | ICD-10-CM

## 2023-05-27 DIAGNOSIS — L539 Erythematous condition, unspecified: Secondary | ICD-10-CM | POA: Diagnosis present

## 2023-05-27 MED ORDER — NAPROXEN 500 MG PO TABS: 500.0000 mg | ORAL_TABLET | Freq: Two times a day (BID) | ORAL | 1 refills | Status: DC

## 2023-05-27 NOTE — ED Notes (Signed)
 [place] at bedside

## 2023-05-27 NOTE — Discharge Instructions (Addendum)
 Follow-up with your primary care provider if any continued problems or concerns, [facility] acute care or return to the emergency department if not improving or if any worsening symptoms of your left leg.  Warm moist compresses to your leg frequently but continue to walk and do regular activities.  You may take Tylenol  in addition to the naproxen if needed for pain.  A prescription for naproxen was sent to the pharmacy which is an anti-inflammatory.  Take this twice a day with food.  Avoid rubbing your leg which may aggravate your leg.

## 2023-05-27 NOTE — ED Triage Notes (Signed)
 Pt to ED from [facility] for DVT rule out. Pt has large, ropey varicose veins to both legs. Pt states varicose veins to L calf seem worse since 1 week ago. Pt also self medicated with leftover abx this past week, explained importance of not doing that. Pt denies SOB. Leg is not inflamed or red.

## 2023-05-27 NOTE — ED Provider Notes (Signed)
 [facility] Provider Note    Event Date/Time   First MD Initiated Contact with Patient 05/27/23 [phone]     (approximate)   History    DVT rule out from [name]   HPI  Catherine Lewis is a 38 y.o. female   presents to the ED with complaint of left lower extremity erythema and concerns for a DVT.  Patient states that her family has had the GI bug and she has been at home for approximately 1 week.  She states that she also had symptoms but not as bad as her twins and her husband.  No previous DVT.  Non-smoker, no recent travel but patient does have an IUD with estrogen.  She also reports that she had some leftover cephalexin  500 mg that she has been taking 4 times daily for the last 5 days.  Patient has a history of gestational diabetes, anemia and anxiety.      Physical Exam   Triage Vital Signs: ED Triage Vitals  Encounter Vitals Group     BP 05/27/23 0826 (!) 145/97     Systolic BP Percentile --      Diastolic BP Percentile --      Pulse Rate 05/27/23 0826 72     Resp 05/27/23 0826 20     Temp 05/27/23 0826 98.3 F (36.8 C)     Temp Source 05/27/23 0826 Oral     SpO2 05/27/23 0826 100 %     Weight 05/27/23 0827 (!) 338 lb (153.3 kg)     Height 05/27/23 0827 5\' 5"  (1.651 m)     Head Circumference --      Peak Flow --      Pain Score 05/27/23 0825 0     Pain Loc --      Pain Education --      Exclude from Growth Chart --     Most recent vital signs: Vitals:   05/27/23 0826  BP: (!) 145/97  Pulse: 72  Resp: 20  Temp: 98.3 F (36.8 C)  SpO2: 100%     General: Awake, no distress.  CV:  Good peripheral perfusion.  Resp:  Normal effort.  Abd:  No distention.  Other:  Bilateral legs with varicose veins present.  Left medial calf area with minimal inflammation/erythema but tenderness is present.  Skin is intact.  No warmth is appreciated.  Pulses are present bilaterally.  Good muscle strength bilaterally.  Patient is able to stand and ambulate  without any assistance.  Celine Collard' sign negative.   ED Results / Procedures / Treatments   Labs (all labs ordered are listed, but only abnormal results are displayed) Labs Reviewed - No data to display   RADIOLOGY  Ultrasound left leg venous images were reviewed and interpreted by the radiologist as negative for DVT.  Findings more consistent with superficial thrombophlebitis.   PROCEDURES:  Critical Care performed:   Procedures   MEDICATIONS ORDERED IN ED: Medications - No data to display   IMPRESSION / MDM / ASSESSMENT AND PLAN / ED COURSE  I reviewed the triage vital signs and the nursing notes.   Differential diagnosis includes, but is not limited to, DVT, superficial thrombus phlebitis, coarse varicose veins, musculoskeletal pain, left lower extremity muscle strain, cellulitis.  38 year old female presents to the ED to rule out a DVT.  Patient states she has had problems with varicose veins in both her legs in the past but in the past week has felt like  her left leg was worse.  She has some leftover cephalexin  that she has been taking for the past 5 days.  She denies any injury but states she did get into some poison oak prior to this event.  She denies any itching at this time.  Patient was made aware that radiology report is negative for DVT but thrombophlebitis would be treated with an anti-inflammatory.  Encouraged her to use warm moist compresses and continued to ambulate.  A prescription for naproxen 500 mg twice daily was sent to the pharmacy.  She is to follow-up with her PCP but if there is any worsening of her symptoms or urgent concerns she is return to the emergency department.      Patient's presentation is most consistent with acute presentation with potential threat to life or bodily function.  FINAL CLINICAL IMPRESSION(S) / ED DIAGNOSES   Final diagnoses:  Superficial thrombophlebitis of left leg     Rx / DC Orders   ED Discharge Orders           Ordered    naproxen (NAPROSYN) 500 MG tablet  2 times daily with meals        05/27/23 1012             Note:  This document was prepared using Dragon voice recognition software and may include unintentional dictation errors.   Stafford Eagles, PA-C 05/27/23 1320    Ruth Cove, MD 05/27/23 1438

## 2023-08-21 ENCOUNTER — Other Ambulatory Visit

## 2023-08-29 NOTE — H&P (Addendum)
 Chief Complaint:   Patient ID: Catherine Lewis is a 38 y.o. female presenting with Follow-up  on 08/29/2023  HPI: Heavy menstrual bleeding unresolved with IUD  Body mass index is 55.52 kg/m.  History of Present Illness The patient, with a history of heavy menstrual bleeding unresponsive to an IUD inserted in August of the previous year, presents for consultation. The patient reports continuous bleeding, requiring the use of tampons, with only a few days of respite. The patient describes the bleeding as tampon worthy and notes the presence of clots, referred to as little spider ones. The patient expresses frustration with the ongoing issue and is considering a hysterectomy as a solution. The patient also mentions a cyst on the ovary and a foot issue that requires surgery. The patient has 8 people living in her house and is active, participating in boot camp exercises.   Pap smear: 1.22 NEGATIVE FOR INTRAEPITHELIAL LESION OR MALIGNANCY.   EMBx: IUD in place  2024 path results: benign, but reported scant. This is odd as I had so much pathology. I repeated an ultrasound 2/25  Uterus ======   Visualized. Size 118 mm x 64 mm x 53 mm Enlarged Position: anteverted Malformations: none Myometrium: appears normal Endometrium: appears normal/appropriate. Endometrial thickness, total 9.6 mm Cervix details: nabothian cysts visualized No fibroids identified No polyps identified IUD seen at the fundal portion of the endometrium   Right Ovary =========   Visualized, Enlarged. Outline: Normal. Morphology: Appropriate. Size 44 mm x 29 mm x 34 mm Follicles identified Cyst(s)    Size 37.0 mm x 31.0 mm x 28 mm. Mean 32.0 mm. Vol 16.816 cm. Complex Cyst with septation measuring 0.17cm   Left Ovary ========   Visualized, Normal. Outline: Normal. Morphology: Appropriate. Size 25 mm x 21 mm x 14 mm No cysts identified Follicles identified seen abdominally   Cul de Sac =========    Normal. No free fluid visualized   Impression =========   Endovaginal and transabdominal ultrasound performed today due to the indications outlined above.  The sonogram reveals an enlarged anteverted uterus.  The endometrium appears normal measuring 9.68mm with an IUD properly positioned at fundal portion  The left ovary appears normal; Rt ovary contains a complex septated cyst detailed above.  There are no unusual adnexal findings.  There is no free fluid.  08/2022 D&C results Uterus measuring 12 cm by sound; normal cervix, vagina, perineum. The endometrial cavity was difficult to be fully assessed, as her bleeding was significantly heavy throughout the procedure. There was an excess of fluffy proliferative tissue. Both tubal ostia were able to be visualized. No discrete polyps or fibroids were noted.   Past Medical History:  has a past medical history of Bleeding disorder () and Obesity (BMI 30-39.9), unspecified.  Past Surgical History:  has a past surgical history that includes Cesarean section; Laparoscopic Gastroplasty Non-Vertical Banding For Obesity (07/2016); Bariatric Surgery (07/2017); Tubal ligation; Dilation and curettage of uterus; and Hysteroscopy. Family History: family history includes Diabetes in her paternal grandmother; Melanoma in her father. Social History:  reports that she has quit smoking. She has been exposed to tobacco smoke. She has never used smokeless tobacco. She reports that she does not currently use alcohol. She reports that she does not use drugs. OB/GYN History:  OB History    Gravida 2  Para 2  Term 1  Preterm 1  AB    Living 3    SAB    IAB    Ectopic  Molar    Multiple 1  Live Births 3       Allergies: has no known allergies. Medications:  Current Outpatient Medications:    naproxen  (NAPROSYN ) 500 MG tablet, Take 500 mg by mouth 2 (two) times daily with meals, Disp: , Rfl:    acetaminophen  (TYLENOL ) 500 MG  tablet, Take by mouth, Disp: , Rfl:    BD VEO INSULIN SYRINGE UF 0.3 mL 31 gauge x 15/64 Syrg, , Disp: , Rfl:    BD VEO INSULIN SYRINGE UF 1/2 mL 31 gauge x 15/64 Syrg, as directed (Patient not taking: Reported on 09/14/2020), Disp: , Rfl:    bisacodyL  (DULCOLAX) 10 mg suppository, Place rectally (Patient not taking: Reported on 05/27/2023), Disp: , Rfl:    blood glucose diagnostic test strip, 1 each (1 strip total) 4 (four) times daily Use as instructed. (Patient not taking: Reported on 05/27/2023), Disp: 120 each, Rfl: 12   coconut oil, bulk, Oil, Apply topically (Patient not taking: Reported on 08/28/2020), Disp: , Rfl:    folic acid (FOLVITE) 1 MG tablet, Take 1 tablet (1 mg total) by mouth once daily (Patient not taking: Reported on 05/27/2023), Disp: 30 tablet, Rfl: 11   FUROsemide (LASIX) 20 MG tablet, Take 1 tablet (20 mg total) by mouth once daily (Patient not taking: Reported on 05/27/2023), Disp: 21 tablet, Rfl: 0   ibuprofen  (MOTRIN ) 600 MG tablet, Take 600 mg by mouth every 6 (six) hours (Patient not taking: Reported on 03/28/2021), Disp: , Rfl:    insulin NPH (HUMULIN N) injection (concentration 100 units/mL), 30 units qAM and 11 units qPM subcutaneous (Patient not taking: Reported on 05/27/2023), Disp: 10 mL, Rfl: 12   insulin REGULAR (NOVOLIN R REGULAR U-100 INSULN) injection (concentration 100 units/mL), INJECT 12 UNITS UNDER THE SKIN IN AM, 14 UNITS UNDER THE SKIN EVERY PM (Patient not taking: Reported on 08/28/2020), Disp: 10 mL, Rfl: 12   labetaloL  (TRANDATE ) 300 MG tablet, TAKE 1 TABLET BY MOUTH 2 TIMES DAILY. (Patient not taking: Reported on 03/28/2021), Disp: 180 tablet, Rfl: 0   labetaloL  (TRANDATE ) 300 MG tablet, Take 300 mg by mouth 2 (two) times daily (Patient not taking: Reported on 03/04/2022), Disp: , Rfl:    lancets, Use 1 each 4 (four) times daily Use as instructed. (Patient not taking: Reported on 05/27/2023), Disp: 120 each, Rfl: 12   levonorgestreL  (MIRENA  52 MG) IUD, Insert 1 each  into the uterus once Follow package directions. (Patient not taking: Reported on 04/29/2023), Disp: , Rfl:    meloxicam (MOBIC) 15 MG tablet, Take 15 mg by mouth once daily (Patient not taking: Reported on 04/29/2023), Disp: , Rfl:    norethindrone (AYGESTIN) 5 mg tablet, TAKE 1 TABLET BY MOUTH THREE TIMES DAILY FOR 5 DAYS THEN TAKE 1 TABLET BY MOUTH TWICE DAILY FOR 5 DAYS THEN TAKE 1 TABLET BY MOUTH DAILY FOR 7 DAYS (Patient not taking: Reported on 04/29/2023), Disp: 32 tablet, Rfl: 0   pen needle, diabetic 32 gauge x 5/16 needle, Use as directed (Patient not taking: Reported on 05/27/2023), Disp: 100 each, Rfl: 12   prenatal vitamin with iron-folic acid (PRENATAL TABLETS) tablet, Take 1 tablet by mouth once daily (Patient not taking: Reported on 03/28/2021), Disp: , Rfl:    semaglutide (WEGOVY) 2.4 mg/0.75 mL pen injector, Inject 0.75 mLs (2.4 mg total) subcutaneously once a week for 84 days (Patient not taking: Reported on 05/27/2023), Disp: 3 mL, Rfl: 2   semaglutide (WEGOVY) 2.4 mg/0.75 mL pen injector, Inject 0.75  mLs (2.4 mg total) subcutaneously once a week for 84 days (Patient not taking: Reported on 04/22/2023), Disp: 3 mL, Rfl: 2   sennosides-docusate (SENOKOT-S) 8.6-50 mg tablet, Take 2 tablets by mouth once daily (Patient not taking: Reported on 09/29/2020), Disp: , Rfl:    sertraline (ZOLOFT) 100 MG tablet, TAKE 2 TABLETS(200 MG) BY MOUTH EVERY DAY, Disp: 180 tablet, Rfl: 3   sertraline (ZOLOFT) 50 MG tablet, Take 1.5 tablets (75 mg total) by mouth once daily (Patient not taking: Reported on 07/03/2022), Disp: 135 tablet, Rfl: 3   simethicone  (MYLICON) 80 MG chewable tablet, Take by mouth (Patient not taking: Reported on 09/29/2020), Disp: , Rfl:    tranexamic acid  (LYSTEDA ) 650 mg tablet, Take 2 tablets (1,300 mg total) by mouth 3 (three) times daily Take for a maximum of 5 days during monthly menstruation. (Patient not taking: Reported on 03/04/2023), Disp: 30 tablet, Rfl: 3  Review of Systems: No SOB,  no palpitations or chest pain, no new lower extremity edema, no nausea or vomiting or bowel or bladder complaints. See HPI for gyn specific ROS.   Exam:    BP (!) 142/83 (BP Location: Left upper arm, Patient Position: Sitting)   Pulse 79   Ht 167.6 cm (5' 6)   Wt (!) 156 kg (344 lb)   BMI 55.52 kg/m   General: Patient is well-groomed, well-nourished, appears stated age in no acute distress   HEENT: head is atraumatic and normocephalic, trachea is midline, neck is supple with no palpable nodules   CV: Regular rhythm and normal heart rate, no murmur   Pulm: Clear to auscultation throughout lung fields with no wheezing, crackles, or rhonchi. No increased work of breathing  Abdomen: soft , no mass, non-tender, no rebound tenderness, no hepatomegaly, limited by habitus  Pelvic: deferred       Impression:  The primary encounter diagnosis was Screening for cervical cancer. Diagnoses of Excessive or frequent menstruation and Abnormal uterine bleeding (AUB) were also pertinent to this visit.    Plan:  Patient returns for a preoperative discussion regarding her plans to proceed with definitive surgical treatment of her AUB failed medical management by  robotic assisted total laparoscopic hysterectomy with bilateral salpingectomy.   Body mass index is 55.52 kg/m. 2 prior cesarean sections We talked about updating her pap smear, she declined, and repeat procedures might be needed. Plan for Lovenox  with procedure She is worried about constipation (senna, miralax) and will see podiatry for procedure as well. Would like to take home the IUD  The patient and I discussed the technical aspects of the procedure including the potential for risks and complications.  These include but are not limited to the risk of infection requiring post-operative antibiotics or further procedures.  We talked about the risk of injury to adjacent organs including bladder, bowel, ureter, blood vessels or  nerves.  We talked about the need to convert to an open incision.  We talked about the possible need for blood transfusion.  We talked about postop complications such as thromboembolic or cardiopulmonary complications.  All of her questions were answered.  Her preoperative exam was completed and the appropriate consents were signed. She is scheduled to undergo this procedure in the near future.  She would like to have this surgery combined with podiatry planned procedure. She will not be immobile, but will give 3 weeks of lovenox  postop   Catherine Lewis Catherine Eagleson, MD

## 2023-09-04 ENCOUNTER — Other Ambulatory Visit: Payer: Self-pay | Admitting: Podiatry

## 2023-09-05 ENCOUNTER — Encounter
Admission: RE | Admit: 2023-09-05 | Discharge: 2023-09-05 | Disposition: A | Discharge status: Home / Self Care | Payer: Self-pay | Source: Ambulatory Visit | Attending: Obstetrics and Gynecology | Admitting: Obstetrics and Gynecology

## 2023-09-05 ENCOUNTER — Other Ambulatory Visit: Payer: Self-pay

## 2023-09-05 VITALS — Ht 66.0 in | Wt 340.0 lb

## 2023-09-05 DIAGNOSIS — O163 Unspecified maternal hypertension, third trimester: Secondary | ICD-10-CM

## 2023-09-05 DIAGNOSIS — Z01818 Encounter for other preprocedural examination: Secondary | ICD-10-CM

## 2023-09-05 NOTE — Patient Instructions (Addendum)
 Your procedure is scheduled on: Friday 09/12/23 Report to the Registration Desk on the 1st floor of the [facility]. To find out your arrival time, please call [phone] between 1PM - 3PM on: Thursday 09/11/23 If your arrival time is 6:00 am, do not arrive before that time as the [facility] entrance doors do not open until 6:00 am.  REMEMBER: Instructions that are not followed completely may result in serious medical risk, up to and including death; or upon the discretion of your surgeon and anesthesiologist your surgery may need to be rescheduled.  Do not eat food after midnight the night before surgery.  No gum chewing or hard candies.  You may however, drink CLEAR liquids up to 2 hours before you are scheduled to arrive for your surgery. Do not drink anything within 2 hours of your scheduled arrival time.  Clear liquids include: - water  - apple juice without pulp - gatorade (not RED colors) - black coffee or tea (Do NOT add milk or creamers to the coffee or tea) Do NOT drink anything that is not on this list.  One week prior to surgery: Stop Anti-inflammatories (NSAIDS) such as Advil , Aleve , Ibuprofen , Motrin , Naproxen , Naprosyn  and Aspirin based products such as Excedrin, Goody's Powder, BC Powder.  You may however, continue to take Tylenol  if needed for pain up until the day of surgery.  Stop ALL OVER THE COUNTER supplements and vitamins until after surgery.  Continue taking all of your other prescription medications up until the day of surgery.  ON THE DAY OF SURGERY ONLY TAKE THESE MEDICATIONS WITH SIPS OF WATER:  sertraline (ZOLOFT)   No Alcohol for 24 hours before or after surgery.  No Smoking including e-cigarettes for 24 hours before surgery.  No chewable tobacco products for at least 6 hours before surgery.  No nicotine patches on the day of surgery.  Do not use any recreational drugs for at least a week (preferably 2 weeks) before your surgery.  Please be  advised that the combination of cocaine and anesthesia may have negative outcomes, up to and including death. If you test positive for cocaine, your surgery will be cancelled.  On the morning of surgery brush your teeth with toothpaste and water, you may rinse your mouth with mouthwash if you wish. Do not swallow any toothpaste or mouthwash.  Do not shave body hair from the neck down 48 hours before surgery.  Use CHG Soap or wipes as directed on instruction sheet.  Do not wear lotions, powders, or perfumes.  Wear comfortable clothing (specific to your surgery type) to the hospital.  Do not wear jewelry, make-up, hairpins, clips or nail polish.  For welded (permanent) jewelry: bracelets, anklets, waist bands, etc.  Please have this removed prior to surgery.  If it is not removed, there is a chance that hospital personnel will need to cut it off on the day of surgery.   Contact lenses, hearing aids and dentures may not be worn into surgery.  Do not bring valuables to the hospital. [facility] is not responsible for any missing/lost belongings or valuables.   Notify your doctor if there is any change in your medical condition (cold, fever, infection).  After surgery, you can help prevent lung complications by doing breathing exercises.  Take deep breaths and cough every 1-2 hours. Your doctor may order a device called an Incentive Spirometer to help you take deep breaths. When coughing or sneezing, hold a pillow firmly against your incision with  both hands. This is called "splinting." Doing this helps protect your incision. It also decreases belly discomfort.  If you are being discharged the day of surgery, you will not be allowed to drive home. You will need a responsible individual to drive you home and stay with you for 24 hours after surgery.   Please call the Pre-admissions Testing Dept. at [phone] if you have any questions about these instructions.  Surgery Visitation  Policy:  Patients having surgery or a procedure may have two visitors.  Children under the age of 76 must have an adult with them who is not the patient.   Merchandiser, retail to address health-related social needs:  [email or web address]     Preparing for Surgery with CHLORHEXIDINE  GLUCONATE (CHG) Soap  Chlorhexidine  Gluconate (CHG) Soap  o An antiseptic cleaner that kills germs and bonds with the skin to continue killing germs even after washing  o Used for showering the night before surgery and morning of surgery  Before surgery, you can play an important role by reducing the number of germs on your skin.  CHG (Chlorhexidine  gluconate) soap is an antiseptic cleanser which kills germs and bonds with the skin to continue killing germs even after washing.  Please do not use if you have an allergy to CHG or antibacterial soaps. If your skin becomes reddened/irritated stop using the CHG.  1. Shower the NIGHT BEFORE SURGERY and the MORNING OF SURGERY with CHG soap.  2. If you choose to wash your hair, wash your hair first as usual with your normal shampoo.  3. After shampooing, rinse your hair and body thoroughly to remove the shampoo.  4. Use CHG as you would any other liquid soap. You can apply CHG directly to the skin and wash gently with a scrungie or a clean washcloth.  5. Apply the CHG soap to your body only from the neck down. Do not use on open wounds or open sores. Avoid contact with your eyes, ears, mouth, and genitals (private parts). Wash face and genitals (private parts) with your normal soap.  6. Wash thoroughly, paying special attention to the area where your surgery will be performed.  7. Thoroughly rinse your body with warm water.  8. Do not shower/wash with your normal soap after using and rinsing off the CHG soap.  9. Pat yourself dry with a clean towel.  10. Wear clean pajamas to bed the night before surgery.  12. Place clean sheets on  your bed the night of your first shower and do not sleep with pets.  13. Shower again with the CHG soap on the day of surgery prior to arriving at the hospital.  14. Do not apply any deodorants/lotions/powders.  15. Please wear clean clothes to the hospital.  How to Use an Incentive Spirometer  An incentive spirometer is a tool that measures how well you are filling your lungs with each breath. Learning to take long, deep breaths using this tool can help you keep your lungs clear and active. This may help to reverse or lessen your chance of developing breathing (pulmonary) problems, especially infection. You may be asked to use a spirometer: After a surgery. If you have a lung problem or a history of smoking. After a long period of time when you have been unable to move or be active. If the spirometer includes an indicator to show the highest number that you have reached, your health care provider or respiratory therapist will help you set a goal.  Keep a log of your progress as told by your health care provider. What are the risks? Breathing too quickly may cause dizziness or cause you to pass out. Take your time so you do not get dizzy or light-headed. If you are in pain, you may need to take pain medicine before doing incentive spirometry. It is harder to take a deep breath if you are having pain. How to use your incentive spirometer  Sit up on the edge of your bed or on a chair. Hold the incentive spirometer so that it is in an upright position. Before you use the spirometer, breathe out normally. Place the mouthpiece in your mouth. Make sure your lips are closed tightly around it. Breathe in slowly and as deeply as you can through your mouth, causing the piston or the ball to rise toward the top of the chamber. Hold your breath for 3-5 seconds, or for as long as possible. If the spirometer includes a coach indicator, use this to guide you in breathing. Slow down your breathing if the  indicator goes above the marked areas. Remove the mouthpiece from your mouth and breathe out normally. The piston or ball will return to the bottom of the chamber. Rest for a few seconds, then repeat the steps 10 or more times. Take your time and take a few normal breaths between deep breaths so that you do not get dizzy or light-headed. Do this every 1-2 hours when you are awake. If the spirometer includes a goal marker to show the highest number you have reached (best effort), use this as a goal to work toward during each repetition. After each set of 10 deep breaths, cough a few times. This will help to make sure that your lungs are clear. If you have an incision on your chest or abdomen from surgery, place a pillow or a rolled-up towel firmly against the incision when you cough. This can help to reduce pain while taking deep breaths and coughing. General tips When you are able to get out of bed: Walk around often. Continue to take deep breaths and cough in order to clear your lungs. Keep using the incentive spirometer until your health care provider says it is okay to stop using it. If you have been in the hospital, you may be told to keep using the spirometer at home. Contact a health care provider if: You are having difficulty using the spirometer. You have trouble using the spirometer as often as instructed. Your pain medicine is not giving enough relief for you to use the spirometer as told. You have a fever. Get help right away if: You develop shortness of breath. You develop a cough with bloody mucus from the lungs. You have fluid or blood coming from an incision site after you cough. Summary An incentive spirometer is a tool that can help you learn to take long, deep breaths to keep your lungs clear and active. You may be asked to use a spirometer after a surgery, if you have a lung problem or a history of smoking, or if you have been inactive for a long period of time. Use your  incentive spirometer as instructed every 1-2 hours while you are awake. If you have an incision on your chest or abdomen, place a pillow or a rolled-up towel firmly against your incision when you cough. This will help to reduce pain. Get help right away if you have shortness of breath, you cough up bloody mucus, or blood comes  from your incision when you cough. This information is not intended to replace advice given to you by your health care provider. Make sure you discuss any questions you have with your health care provider. Document Revised: 04/05/2019 Document Reviewed: 04/05/2019 Elsevier Patient Education  2023 [employer].

## 2023-09-08 ENCOUNTER — Encounter: Payer: Self-pay | Admitting: Urgent Care

## 2023-09-08 ENCOUNTER — Encounter
Admission: RE | Admit: 2023-09-08 | Discharge: 2023-09-08 | Disposition: A | Discharge status: Home / Self Care | Payer: Self-pay | Source: Ambulatory Visit | Attending: Obstetrics and Gynecology | Admitting: Obstetrics and Gynecology

## 2023-09-08 DIAGNOSIS — N939 Abnormal uterine and vaginal bleeding, unspecified: Secondary | ICD-10-CM | POA: Insufficient documentation

## 2023-09-08 DIAGNOSIS — O163 Unspecified maternal hypertension, third trimester: Secondary | ICD-10-CM | POA: Insufficient documentation

## 2023-09-08 DIAGNOSIS — Z3A00 Weeks of gestation of pregnancy not specified: Secondary | ICD-10-CM | POA: Insufficient documentation

## 2023-09-08 DIAGNOSIS — Z01818 Encounter for other preprocedural examination: Secondary | ICD-10-CM | POA: Insufficient documentation

## 2023-09-08 DIAGNOSIS — Z01810 Encounter for preprocedural cardiovascular examination: Secondary | ICD-10-CM | POA: Diagnosis not present

## 2023-09-08 DIAGNOSIS — R9431 Abnormal electrocardiogram [ECG] [EKG]: Secondary | ICD-10-CM | POA: Insufficient documentation

## 2023-09-08 LAB — BASIC METABOLIC PANEL WITH GFR
Anion gap: 11 (ref 5–15)
BUN: 15 mg/dL (ref 6–20)
CO2: 25 mmol/L (ref 22–32)
Calcium: 9.9 mg/dL (ref 8.9–10.3)
Chloride: 105 mmol/L (ref 98–111)
Creatinine, Ser: 0.85 mg/dL (ref 0.44–1.00)
GFR, Estimated: >60 mL/min (ref >60)
Glucose, Bld: 82 mg/dL (ref 70–99)
Potassium: 3.6 mmol/L (ref 3.5–5.1)
Sodium: 141 mmol/L (ref 135–145)

## 2023-09-08 LAB — CBC
HCT: 40.7 % (ref 36.0–46.0)
Hemoglobin: 14 g/dL (ref 12.0–15.0)
MCH: 30.2 pg (ref 26.0–34.0)
MCHC: 34.4 g/dL (ref 30.0–36.0)
MCV: 87.9 fL (ref 80.0–100.0)
Platelets: 252 K/uL (ref 150–400)
RBC: 4.63 MIL/uL (ref 3.87–5.11)
RDW: 12.9 % (ref 11.5–15.5)
WBC: 9.4 K/uL (ref 4.0–10.5)
nRBC: 0 % (ref 0.0–0.2)

## 2023-09-08 LAB — TYPE AND SCREEN
ABO/RH(D): O POS
Antibody Screen: NEGATIVE

## 2023-09-11 MED ORDER — CEFAZOLIN SODIUM-DEXTROSE 2-4 GM/100ML-% IV SOLN
2.0000 g | INTRAVENOUS | Status: DC
  Filled 2023-09-11: qty 100

## 2023-09-11 MED ORDER — ORAL CARE MOUTH RINSE: 15.0000 mL | Freq: Once | OROMUCOSAL | Status: AC

## 2023-09-11 MED ORDER — LACTATED RINGERS IV SOLN: INTRAVENOUS | Status: DC

## 2023-09-11 MED ORDER — CEFAZOLIN SODIUM-DEXTROSE 3-4 GM/150ML-% IV SOLN
3.0000 g | INTRAVENOUS | Status: AC
  Administered 2023-09-12 (×2): 3 g via INTRAVENOUS
  Filled 2023-09-11: qty 150

## 2023-09-11 MED ORDER — ENOXAPARIN SODIUM 40 MG/0.4ML IJ SOSY: 40.0000 mg | PREFILLED_SYRINGE | INTRAMUSCULAR | Status: DC

## 2023-09-11 MED ORDER — ACETAMINOPHEN 500 MG PO TABS
1000.0000 mg | ORAL_TABLET | ORAL | Status: AC
  Administered 2023-09-12: 1000 mg via ORAL
  Filled 2023-09-11: qty 2

## 2023-09-11 MED ORDER — POVIDONE-IODINE 10 % EX SWAB
2.0000 | Freq: Once | CUTANEOUS | Status: AC
  Administered 2023-09-12: 2 via TOPICAL

## 2023-09-11 MED ORDER — CHLORHEXIDINE GLUCONATE 0.12 % MT SOLN
15.0000 mL | Freq: Once | OROMUCOSAL | Status: AC
  Administered 2023-09-12: 15 mL via OROMUCOSAL
  Filled 2023-09-11: qty 15

## 2023-09-12 ENCOUNTER — Ambulatory Visit: Payer: Self-pay | Admitting: Urgent Care

## 2023-09-12 ENCOUNTER — Encounter
Admission: RE | Disposition: A | Discharge status: Home / Self Care | Payer: Self-pay | Source: Home / Self Care | Attending: Obstetrics and Gynecology

## 2023-09-12 ENCOUNTER — Ambulatory Visit: Payer: Self-pay | Admitting: Anesthesiology

## 2023-09-12 ENCOUNTER — Other Ambulatory Visit: Payer: Self-pay

## 2023-09-12 ENCOUNTER — Encounter: Payer: Self-pay | Admitting: Obstetrics and Gynecology

## 2023-09-12 ENCOUNTER — Ambulatory Visit
Admission: RE | Admit: 2023-09-12 | Discharge: 2023-09-12 | Disposition: A | Discharge status: Home / Self Care | Payer: Self-pay | Attending: Obstetrics and Gynecology | Admitting: Obstetrics and Gynecology

## 2023-09-12 DIAGNOSIS — Y92234 Operating room of hospital as the place of occurrence of the external cause: Secondary | ICD-10-CM | POA: Diagnosis not present

## 2023-09-12 DIAGNOSIS — Z6841 Body Mass Index (BMI) 40.0 and over, adult: Secondary | ICD-10-CM | POA: Diagnosis not present

## 2023-09-12 DIAGNOSIS — Y658 Other specified misadventures during surgical and medical care: Secondary | ICD-10-CM | POA: Diagnosis not present

## 2023-09-12 DIAGNOSIS — K9172 Accidental puncture and laceration of a digestive system organ or structure during other procedure: Secondary | ICD-10-CM | POA: Diagnosis not present

## 2023-09-12 DIAGNOSIS — E66813 Obesity, class 3: Secondary | ICD-10-CM | POA: Insufficient documentation

## 2023-09-12 DIAGNOSIS — Z833 Family history of diabetes mellitus: Secondary | ICD-10-CM | POA: Diagnosis not present

## 2023-09-12 DIAGNOSIS — Z9851 Tubal ligation status: Secondary | ICD-10-CM | POA: Diagnosis not present

## 2023-09-12 DIAGNOSIS — N888 Other specified noninflammatory disorders of cervix uteri: Secondary | ICD-10-CM | POA: Insufficient documentation

## 2023-09-12 DIAGNOSIS — Z9884 Bariatric surgery status: Secondary | ICD-10-CM | POA: Insufficient documentation

## 2023-09-12 DIAGNOSIS — E119 Type 2 diabetes mellitus without complications: Secondary | ICD-10-CM | POA: Diagnosis not present

## 2023-09-12 DIAGNOSIS — Z7985 Long-term (current) use of injectable non-insulin antidiabetic drugs: Secondary | ICD-10-CM | POA: Insufficient documentation

## 2023-09-12 DIAGNOSIS — N939 Abnormal uterine and vaginal bleeding, unspecified: Secondary | ICD-10-CM | POA: Diagnosis present

## 2023-09-12 DIAGNOSIS — M722 Plantar fascial fibromatosis: Secondary | ICD-10-CM | POA: Diagnosis not present

## 2023-09-12 DIAGNOSIS — Z87891 Personal history of nicotine dependence: Secondary | ICD-10-CM | POA: Insufficient documentation

## 2023-09-12 DIAGNOSIS — G588 Other specified mononeuropathies: Secondary | ICD-10-CM | POA: Insufficient documentation

## 2023-09-12 DIAGNOSIS — Z794 Long term (current) use of insulin: Secondary | ICD-10-CM | POA: Insufficient documentation

## 2023-09-12 DIAGNOSIS — Z01818 Encounter for other preprocedural examination: Secondary | ICD-10-CM

## 2023-09-12 HISTORY — PX: IUD REMOVAL: SHX5392

## 2023-09-12 HISTORY — PX: PLANTAR FASCIA RELEASE: SHX2239

## 2023-09-12 HISTORY — PX: SMALL BOWEL REPAIR: SHX6447

## 2023-09-12 HISTORY — PX: HYSTERECTOMY, TOTAL, LAPAROSCOPIC, ROBOT-ASSISTED WITH SALPINGECTOMY: SHX7587

## 2023-09-12 HISTORY — PX: ROBOTIC ASSISTED LAPAROSCOPIC LYSIS OF ADHESION: SHX6080

## 2023-09-12 HISTORY — PX: CYSTOSCOPY: SHX5120

## 2023-09-12 HISTORY — PX: NERVE REPAIR: SHX2083

## 2023-09-12 LAB — POCT PREGNANCY, URINE: Preg Test, Ur: NEGATIVE

## 2023-09-12 SURGERY — HYSTERECTOMY, TOTAL, LAPAROSCOPIC, ROBOT-ASSISTED WITH SALPINGECTOMY
Anesthesia: General | Site: Uterus | Laterality: Right

## 2023-09-12 MED ORDER — SUCCINYLCHOLINE CHLORIDE 200 MG/10ML IV SOSY
PREFILLED_SYRINGE | INTRAVENOUS | Status: DC | PRN
  Administered 2023-09-12: 120 mg via INTRAVENOUS

## 2023-09-12 MED ORDER — BUPIVACAINE-EPINEPHRINE (PF) 0.25% -1:200000 IJ SOLN
INTRAMUSCULAR | Status: AC
  Filled 2023-09-12: qty 30

## 2023-09-12 MED ORDER — PROPOFOL 10 MG/ML IV BOLUS
INTRAVENOUS | Status: AC
  Filled 2023-09-12: qty 40

## 2023-09-12 MED ORDER — ONDANSETRON 4 MG PO TBDP: 4.0000 mg | ORAL_TABLET | Freq: Three times a day (TID) | ORAL | 0 refills | Status: AC | PRN

## 2023-09-12 MED ORDER — PHENYLEPHRINE 80 MCG/ML (10ML) SYRINGE FOR IV PUSH (FOR BLOOD PRESSURE SUPPORT)
PREFILLED_SYRINGE | INTRAVENOUS | Status: AC
Start: 2023-09-12 — End: 2023-09-12
  Filled 2023-09-12: qty 10

## 2023-09-12 MED ORDER — GLYCOPYRROLATE 0.2 MG/ML IJ SOLN
INTRAMUSCULAR | Status: AC
  Filled 2023-09-12: qty 1

## 2023-09-12 MED ORDER — IBUPROFEN 800 MG PO TABS: 800.0000 mg | ORAL_TABLET | Freq: Three times a day (TID) | ORAL | 1 refills | Status: AC

## 2023-09-12 MED ORDER — ROCURONIUM BROMIDE 10 MG/ML (PF) SYRINGE
PREFILLED_SYRINGE | INTRAVENOUS | Status: AC
Start: 2023-09-12 — End: 2023-09-12
  Filled 2023-09-12: qty 20

## 2023-09-12 MED ORDER — BUPIVACAINE-EPINEPHRINE (PF) 0.25% -1:200000 IJ SOLN: INTRAMUSCULAR | Status: DC | PRN

## 2023-09-12 MED ORDER — BUPIVACAINE-EPINEPHRINE (PF) 0.25% -1:200000 IJ SOLN
INTRAMUSCULAR | Status: DC | PRN
  Administered 2023-09-12: 20 mL

## 2023-09-12 MED ORDER — PROPOFOL 500 MG/50ML IV EMUL
INTRAVENOUS | Status: DC | PRN
  Administered 2023-09-12: 20 ug/kg/min via INTRAVENOUS

## 2023-09-12 MED ORDER — LIDOCAINE HCL (PF) 2 % IJ SOLN
INTRAMUSCULAR | Status: AC
  Filled 2023-09-12: qty 5

## 2023-09-12 MED ORDER — FENTANYL CITRATE (PF) 100 MCG/2ML IJ SOLN: 25.0000 ug | INTRAMUSCULAR | Status: DC | PRN

## 2023-09-12 MED ORDER — PHENYLEPHRINE HCL-NACL 20-0.9 MG/250ML-% IV SOLN
INTRAVENOUS | Status: DC | PRN
Start: 2023-09-12 — End: 2023-09-12
  Administered 2023-09-12: 30 ug/min via INTRAVENOUS

## 2023-09-12 MED ORDER — HYDROMORPHONE HCL 1 MG/ML IJ SOLN
INTRAMUSCULAR | Status: AC
  Filled 2023-09-12: qty 1

## 2023-09-12 MED ORDER — DEXMEDETOMIDINE HCL IN NACL 80 MCG/20ML IV SOLN
INTRAVENOUS | Status: DC | PRN
  Administered 2023-09-12: 4 ug via INTRAVENOUS
  Administered 2023-09-12: 8 ug via INTRAVENOUS
  Administered 2023-09-12 (×2): 4 ug via INTRAVENOUS
  Administered 2023-09-12: 8 ug via INTRAVENOUS

## 2023-09-12 MED ORDER — BUPIVACAINE HCL (PF) 0.5 % IJ SOLN
INTRAMUSCULAR | Status: AC
  Filled 2023-09-12: qty 30

## 2023-09-12 MED ORDER — CHLORHEXIDINE GLUCONATE 0.12 % MT SOLN
OROMUCOSAL | Status: AC
  Filled 2023-09-12: qty 15

## 2023-09-12 MED ORDER — GLYCOPYRROLATE 0.2 MG/ML IJ SOLN
INTRAMUSCULAR | Status: DC | PRN
  Administered 2023-09-12: .2 mg via INTRAVENOUS

## 2023-09-12 MED ORDER — ONDANSETRON HCL 4 MG/2ML IJ SOLN
INTRAMUSCULAR | Status: AC
  Filled 2023-09-12: qty 2

## 2023-09-12 MED ORDER — KETAMINE HCL 50 MG/5ML IJ SOSY
PREFILLED_SYRINGE | INTRAMUSCULAR | Status: AC
  Filled 2023-09-12: qty 5

## 2023-09-12 MED ORDER — FENTANYL CITRATE (PF) 100 MCG/2ML IJ SOLN
INTRAMUSCULAR | Status: DC | PRN
  Administered 2023-09-12: 100 ug via INTRAVENOUS

## 2023-09-12 MED ORDER — ROCURONIUM BROMIDE 10 MG/ML (PF) SYRINGE
PREFILLED_SYRINGE | INTRAVENOUS | Status: AC
  Filled 2023-09-12: qty 20

## 2023-09-12 MED ORDER — ENOXAPARIN SODIUM 40 MG/0.4ML IJ SOSY
40.0000 mg | PREFILLED_SYRINGE | INTRAMUSCULAR | 0 refills | Status: AC
Start: 2023-09-12 — End: ?

## 2023-09-12 MED ORDER — HYDROMORPHONE HCL 1 MG/ML IJ SOLN
INTRAMUSCULAR | Status: DC | PRN
  Administered 2023-09-12: 1 mg via INTRAVENOUS

## 2023-09-12 MED ORDER — ACETAMINOPHEN EXTRA STRENGTH 500 MG PO TABS: 1000.0000 mg | ORAL_TABLET | Freq: Four times a day (QID) | ORAL | 0 refills | Status: AC

## 2023-09-12 MED ORDER — OXYCODONE HCL 5 MG PO TABS: 5.0000 mg | ORAL_TABLET | ORAL | 0 refills | Status: AC | PRN

## 2023-09-12 MED ORDER — METHYLENE BLUE (ANTIDOTE) 1 % IV SOLN
INTRAVENOUS | Status: DC | PRN
  Administered 2023-09-12: .5 mL

## 2023-09-12 MED ORDER — PHENYLEPHRINE HCL (PRESSORS) 10 MG/ML IV SOLN
INTRAVENOUS | Status: DC | PRN
Start: 2023-09-12 — End: 2023-09-12
  Administered 2023-09-12 (×2): 80 ug via INTRAVENOUS

## 2023-09-12 MED ORDER — CEFAZOLIN SODIUM 1 G IJ SOLR
INTRAMUSCULAR | Status: AC
Start: 2023-09-12 — End: 2023-09-12
  Filled 2023-09-12: qty 30

## 2023-09-12 MED ORDER — ROCURONIUM BROMIDE 100 MG/10ML IV SOLN
INTRAVENOUS | Status: DC | PRN
Start: 2023-09-12 — End: 2023-09-12
  Administered 2023-09-12: 50 mg via INTRAVENOUS
  Administered 2023-09-12: 20 mg via INTRAVENOUS
  Administered 2023-09-12: 5 mg via INTRAVENOUS
  Administered 2023-09-12: 30 mg via INTRAVENOUS
  Administered 2023-09-12: 75 mg via INTRAVENOUS
  Administered 2023-09-12: 50 mg via INTRAVENOUS
  Administered 2023-09-12: 20 mg via INTRAVENOUS

## 2023-09-12 MED ORDER — SUCCINYLCHOLINE CHLORIDE 200 MG/10ML IV SOSY
PREFILLED_SYRINGE | INTRAVENOUS | Status: AC
Start: 2023-09-12 — End: 2023-09-12
  Filled 2023-09-12: qty 10

## 2023-09-12 MED ORDER — MIDAZOLAM HCL 2 MG/2ML IJ SOLN
INTRAMUSCULAR | Status: DC | PRN
  Administered 2023-09-12: 2 mg via INTRAVENOUS

## 2023-09-12 MED ORDER — GABAPENTIN 300 MG PO CAPS: 300.0000 mg | ORAL_CAPSULE | Freq: Every day | ORAL | 0 refills | Status: AC

## 2023-09-12 MED ORDER — ONDANSETRON HCL 4 MG PO TABS: 4.0000 mg | ORAL_TABLET | Freq: Four times a day (QID) | ORAL | Status: DC | PRN

## 2023-09-12 MED ORDER — SILVER NITRATE-POT NITRATE 75-25 % EX MISC
CUTANEOUS | Status: AC
  Filled 2023-09-12: qty 10

## 2023-09-12 MED ORDER — PROPOFOL 1000 MG/100ML IV EMUL
INTRAVENOUS | Status: AC
  Filled 2023-09-12: qty 100

## 2023-09-12 MED ORDER — LIDOCAINE HCL (CARDIAC) PF 100 MG/5ML IV SOSY
PREFILLED_SYRINGE | INTRAVENOUS | Status: DC | PRN
  Administered 2023-09-12: 80 mg via INTRAVENOUS

## 2023-09-12 MED ORDER — DROPERIDOL 2.5 MG/ML IJ SOLN: 0.6250 mg | Freq: Once | INTRAMUSCULAR | Status: DC | PRN

## 2023-09-12 MED ORDER — KETOROLAC TROMETHAMINE 30 MG/ML IJ SOLN
INTRAMUSCULAR | Status: DC | PRN
  Administered 2023-09-12: 30 mg via INTRAVENOUS

## 2023-09-12 MED ORDER — BUPIVACAINE LIPOSOME 1.3 % IJ SUSP
INTRAMUSCULAR | Status: AC
  Filled 2023-09-12: qty 10

## 2023-09-12 MED ORDER — METHYLENE BLUE (ANTIDOTE) 1 % IV SOLN
INTRAVENOUS | Status: AC
  Filled 2023-09-12: qty 10

## 2023-09-12 MED ORDER — BUPIVACAINE HCL (PF) 0.5 % IJ SOLN
INTRAMUSCULAR | Status: DC | PRN
  Administered 2023-09-12: 10 mL

## 2023-09-12 MED ORDER — 0.9 % SODIUM CHLORIDE (POUR BTL) OPTIME
TOPICAL | Status: DC | PRN
  Administered 2023-09-12 (×2): 500 mL

## 2023-09-12 MED ORDER — MIDAZOLAM HCL 2 MG/2ML IJ SOLN
INTRAMUSCULAR | Status: AC
  Filled 2023-09-12: qty 2

## 2023-09-12 MED ORDER — FENTANYL CITRATE (PF) 100 MCG/2ML IJ SOLN
INTRAMUSCULAR | Status: AC
Start: 2023-09-12 — End: 2023-09-12
  Filled 2023-09-12: qty 2

## 2023-09-12 MED ORDER — LIDOCAINE HCL (PF) 1 % IJ SOLN
INTRAMUSCULAR | Status: AC
  Filled 2023-09-12: qty 30

## 2023-09-12 MED ORDER — ACETAMINOPHEN 10 MG/ML IV SOLN
INTRAVENOUS | Status: DC | PRN
  Administered 2023-09-12: 1000 mg via INTRAVENOUS

## 2023-09-12 MED ORDER — DEXAMETHASONE SODIUM PHOSPHATE 10 MG/ML IJ SOLN
INTRAMUSCULAR | Status: AC
Start: 2023-09-12 — End: 2023-09-12
  Filled 2023-09-12: qty 1

## 2023-09-12 MED ORDER — ACETAMINOPHEN 500 MG PO TABS
ORAL_TABLET | ORAL | Status: AC
  Filled 2023-09-12: qty 2

## 2023-09-12 MED ORDER — SUGAMMADEX SODIUM 200 MG/2ML IV SOLN
INTRAVENOUS | Status: DC | PRN
  Administered 2023-09-12: 400 mg via INTRAVENOUS

## 2023-09-12 MED ORDER — ONDANSETRON HCL 4 MG/2ML IJ SOLN
INTRAMUSCULAR | Status: DC | PRN
  Administered 2023-09-12: 4 mg via INTRAVENOUS

## 2023-09-12 MED ORDER — DEXAMETHASONE SODIUM PHOSPHATE 10 MG/ML IJ SOLN
INTRAMUSCULAR | Status: DC | PRN
  Administered 2023-09-12: 10 mg via INTRAVENOUS

## 2023-09-12 MED ORDER — METOCLOPRAMIDE HCL 5 MG/ML IJ SOLN: 5.0000 mg | Freq: Three times a day (TID) | INTRAMUSCULAR | Status: DC | PRN

## 2023-09-12 MED ORDER — KETAMINE HCL 10 MG/ML IJ SOLN
INTRAMUSCULAR | Status: DC | PRN
  Administered 2023-09-12: 20 mg via INTRAVENOUS
  Administered 2023-09-12: 10 mg via INTRAVENOUS
  Administered 2023-09-12: 20 mg via INTRAVENOUS

## 2023-09-12 MED ORDER — METOCLOPRAMIDE HCL 10 MG PO TABS: 5.0000 mg | ORAL_TABLET | Freq: Three times a day (TID) | ORAL | Status: DC | PRN

## 2023-09-12 MED ORDER — ONDANSETRON HCL 4 MG/2ML IJ SOLN
4.0000 mg | Freq: Four times a day (QID) | INTRAMUSCULAR | Status: DC | PRN
Start: 2023-09-12 — End: 2023-09-12

## 2023-09-12 MED ORDER — LIDOCAINE-EPINEPHRINE 1 %-1:100000 IJ SOLN
INTRAMUSCULAR | Status: AC
  Filled 2023-09-12: qty 1

## 2023-09-12 MED ORDER — PROPOFOL 10 MG/ML IV BOLUS
INTRAVENOUS | Status: AC
  Filled 2023-09-12: qty 20

## 2023-09-12 MED ORDER — PROPOFOL 10 MG/ML IV BOLUS
INTRAVENOUS | Status: DC | PRN
  Administered 2023-09-12: 200 mg via INTRAVENOUS

## 2023-09-12 MED ORDER — DOCUSATE SODIUM 100 MG PO CAPS: 100.0000 mg | ORAL_CAPSULE | Freq: Two times a day (BID) | ORAL | 0 refills | Status: AC

## 2023-09-12 MED ORDER — LACTATED RINGERS IV SOLN
INTRAVENOUS | Status: DC | PRN
Start: 2023-09-12 — End: 2023-09-12

## 2023-09-12 SURGICAL SUPPLY — 104 items
APPLICATOR COTTON TIP 6 STRL (MISCELLANEOUS) ×18 IMPLANT
BAG URINE DRAIN 2000ML AR STRL (UROLOGICAL SUPPLIES) ×6 IMPLANT
BARRIER ADHS 3X4 INTERCEED (GAUZE/BANDAGES/DRESSINGS) IMPLANT
BENZOIN TINCTURE PRP APPL 2/3 (GAUZE/BANDAGES/DRESSINGS) IMPLANT
BLADE ENDOTRAC PUSH EPF/EGR (MISCELLANEOUS) IMPLANT
BLADE SURG SZ11 CARB STEEL (BLADE) ×6 IMPLANT
BLADE TRIANGLE EPF/EGR ENDO (BLADE) ×6 IMPLANT
BNDG COHESIVE 4X5 TAN STRL LF (GAUZE/BANDAGES/DRESSINGS) ×6 IMPLANT
BNDG ELASTIC 4X5.8 VLCR NS LF (GAUZE/BANDAGES/DRESSINGS) IMPLANT
BNDG ESMARCH 4X12 STRL LF (GAUZE/BANDAGES/DRESSINGS) ×6 IMPLANT
BNDG GAUZE DERMACEA FLUFF 4 (GAUZE/BANDAGES/DRESSINGS) ×6 IMPLANT
BNDG STRETCH 4X75 STRL LF (GAUZE/BANDAGES/DRESSINGS) ×6 IMPLANT
CANNULA CAP OBTURATR AIRSEAL 8 (CAP) ×6 IMPLANT
CATH URTH 16FR FL 2W BLN LF (CATHETERS) ×6 IMPLANT
CHLORAPREP W/TINT 26 (MISCELLANEOUS) IMPLANT
COVER TIP SHEARS 8 DVNC (MISCELLANEOUS) ×6 IMPLANT
CUFF TOURN SGL QUICK 12 (TOURNIQUET CUFF) IMPLANT
CUFF TOURN SGL QUICK 18X4 (TOURNIQUET CUFF) IMPLANT
CUFF TRNQT CYL 24X4X16.5-23 (TOURNIQUET CUFF) IMPLANT
DEFOGGER SCOPE WARM SEASHARP (MISCELLANEOUS) ×6 IMPLANT
DERMABOND ADVANCED .7 DNX12 (GAUZE/BANDAGES/DRESSINGS) ×6 IMPLANT
DRAPE ARM DVNC X/XI (DISPOSABLE) ×18 IMPLANT
DRAPE COLUMN DVNC XI (DISPOSABLE) ×6 IMPLANT
DRAPE EXTREMITY 106X87X128.5 (DRAPES) IMPLANT
DRIVER NDL MEGA 8 DVNC XI (INSTRUMENTS) ×6 IMPLANT
DRIVER NDLE MEGA DVNC XI (INSTRUMENTS) ×6 IMPLANT
DRSG TEGADERM 6X8 (GAUZE/BANDAGES/DRESSINGS) IMPLANT
DRSG TELFA 3X8 NADH STRL (GAUZE/BANDAGES/DRESSINGS) IMPLANT
DURAPREP 26ML APPLICATOR (WOUND CARE) ×6 IMPLANT
ELECTRODE REM PT RTRN 9FT ADLT (ELECTROSURGICAL) ×12 IMPLANT
FORCEPS BPLR FENES DVNC XI (FORCEP) ×6 IMPLANT
GAUZE 4X4 16PLY ~~LOC~~+RFID DBL (SPONGE) ×6 IMPLANT
GAUZE SPONGE 4X4 12PLY STRL (GAUZE/BANDAGES/DRESSINGS) IMPLANT
GAUZE XEROFORM 1X8 LF (GAUZE/BANDAGES/DRESSINGS) ×6 IMPLANT
GLOVE BIO SURGEON STRL SZ7 (GLOVE) ×24 IMPLANT
GLOVE BIO SURGEON STRL SZ7.5 (GLOVE) ×6 IMPLANT
GLOVE BIOGEL PI IND STRL 7.5 (GLOVE) ×24 IMPLANT
GLOVE INDICATOR 7.5 STRL GRN (GLOVE) ×6 IMPLANT
GLOVE INDICATOR 8.0 STRL GRN (GLOVE) ×6 IMPLANT
GOWN STRL REUS W/ TWL LRG LVL3 (GOWN DISPOSABLE) ×18 IMPLANT
GOWN STRL REUS W/ TWL XL LVL3 (GOWN DISPOSABLE) ×12 IMPLANT
IRRIGATION STRYKERFLOW (MISCELLANEOUS) IMPLANT
IRRIGATOR SUCT 8 DISP DVNC XI (IRRIGATION / IRRIGATOR) IMPLANT
IV LACTATED RINGERS 1000ML (IV SOLUTION) IMPLANT
IV NS 1000ML BAXH (IV SOLUTION) IMPLANT
IV NS 250ML BAXH (IV SOLUTION) ×6 IMPLANT
KIT PINK PAD W/HEAD ARM REST (MISCELLANEOUS) ×6 IMPLANT
KIT PRC PRB RTRGD 3ANG KNF HND (MISCELLANEOUS) IMPLANT
KIT TURNOVER CYSTO (KITS) ×6 IMPLANT
KIT TURNOVER KIT A (KITS) ×6 IMPLANT
KWIRE DBL END TROCAR 6X.062 (WIRE) IMPLANT
LABEL OR SOLS (LABEL) ×6 IMPLANT
MANIFOLD NEPTUNE II (INSTRUMENTS) ×12 IMPLANT
MANIPULATOR VCARE LG CRV RETR (MISCELLANEOUS) IMPLANT
MANIPULATOR VCARE SML CRV RETR (MISCELLANEOUS) IMPLANT
MANIPULATOR VCARE STD CRV RETR (MISCELLANEOUS) IMPLANT
NDL 21 GA WING INFUSION (NEEDLE) IMPLANT
NDL HYPO 25GX1X1/2 BEV (NEEDLE) IMPLANT
NDL HYPO 25X1 1.5 SAFETY (NEEDLE) IMPLANT
NDL SAFETY ECLIPSE 18X1.5 (NEEDLE) IMPLANT
NEEDLE 21 GA WING INFUSION (NEEDLE) IMPLANT
NEEDLE HYPO 25GX1X1/2 BEV (NEEDLE) IMPLANT
NEEDLE HYPO 25X1 1.5 SAFETY (NEEDLE) ×12 IMPLANT
NS IRRIG 500ML POUR BTL (IV SOLUTION) ×6 IMPLANT
OBTURATOR OPTICALSTD 8 DVNC (TROCAR) ×6 IMPLANT
OCCLUDER COLPOPNEUMO (BALLOONS) ×6 IMPLANT
PACK DNC HYST (MISCELLANEOUS) ×6 IMPLANT
PACK EXTREMITY ARMC (MISCELLANEOUS) ×6 IMPLANT
PACK GYN LAPAROSCOPIC (MISCELLANEOUS) ×6 IMPLANT
PAD ABD DERMACEA PRESS 5X9 (GAUZE/BANDAGES/DRESSINGS) IMPLANT
PAD PREP OB/GYN DISP 24X41 (PERSONAL CARE ITEMS) ×6 IMPLANT
PENCIL SMOKE EVACUATOR (MISCELLANEOUS) ×6 IMPLANT
RUMI II GYRUS 3.5CM BLUE (DISPOSABLE) IMPLANT
SCISSORS MNPLR CVD DVNC XI (INSTRUMENTS) ×6 IMPLANT
SCRUB CHG 4% DYNA-HEX 4OZ (MISCELLANEOUS) ×6 IMPLANT
SEAL UNIV 5-12 XI (MISCELLANEOUS) ×12 IMPLANT
SEALER VESSEL EXT DVNC XI (MISCELLANEOUS) IMPLANT
SET CYSTO W/LG BORE CLAMP LF (SET/KITS/TRAYS/PACK) IMPLANT
SET TUBE FILTERED XL AIRSEAL (SET/KITS/TRAYS/PACK) ×6 IMPLANT
SOLUTION ELECTROSURG ANTI STCK (MISCELLANEOUS) ×6 IMPLANT
SOLUTION PREP PVP 2OZ (MISCELLANEOUS) ×6 IMPLANT
SPLINT CAST 1 STEP 4X30 (MISCELLANEOUS) IMPLANT
STOCKINETTE IMPERV 14X48 (MISCELLANEOUS) ×6 IMPLANT
STRIP CLOSURE SKIN 1/4X4 (GAUZE/BANDAGES/DRESSINGS) IMPLANT
SURGILUBE 2OZ TUBE FLIPTOP (MISCELLANEOUS) ×6 IMPLANT
SUT ETHILON 5-0 FS-2 18 BLK (SUTURE) IMPLANT
SUT STRATA 3-0 15 RB-1 (SUTURE) IMPLANT
SUT STRATA PDS 0 30 CT-2.5 (SUTURE) ×6 IMPLANT
SUT VIC AB 4-0 SH 27XANBCTRL (SUTURE) ×6 IMPLANT
SUT VICRYL AB 3-0 FS1 BRD 27IN (SUTURE) IMPLANT
SUTURE ETHLN 4-0 FS2 18XMF BLK (SUTURE) IMPLANT
SUTURE MNCRL 4-0 27XMF (SUTURE) ×6 IMPLANT
SYR 10ML LL (SYRINGE) IMPLANT
SYR 3ML LL SCALE MARK (SYRINGE) IMPLANT
SYR 50ML LL SCALE MARK (SYRINGE) ×6 IMPLANT
SYR 5ML LL (SYRINGE) IMPLANT
TIP UTERINE 6.7X10CM GRN DISP (MISCELLANEOUS) IMPLANT
TOWEL OR 17X26 4PK STRL BLUE (TOWEL DISPOSABLE) ×6 IMPLANT
TRAP FLUID SMOKE EVACUATOR (MISCELLANEOUS) ×12 IMPLANT
TUBING ART PRESS 48 MALE/FEM (TUBING) IMPLANT
ULTRASOUND BK UROLOGY (MISCELLANEOUS) IMPLANT
ULTRASOUND BK UROLOGY PROCEDUR (MISCELLANEOUS) IMPLANT
WAND TOPAZ MICRO DEBRIDER (MISCELLANEOUS) ×6 IMPLANT
WATER STERILE IRR 500ML POUR (IV SOLUTION) ×12 IMPLANT

## 2023-09-12 NOTE — Op Note (Addendum)
 Silvano SHAUNNA Bare PROCEDURE DATE: 09/12/2023  PREOPERATIVE DIAGNOSIS: Abnormal uterine bleeding, failed all conservative management POSTOPERATIVE DIAGNOSIS: The same, severe adhesive disease PROCEDURE:  Panel 1 HYSTERECTOMY, TOTAL, LAPAROSCOPIC, ROBOT-ASSISTED WITH SALPINGECTOMY and Oophoropexy: 58571 (CPT)  CYSTOSCOPY: 52000 (CPT)  REMOVAL, INTRAUTERINE DEVICE: 58301 (CPT)  LYSIS, ADHESIONS, ROBOT-ASSISTED, LAPAROSCOPIC:   Panel 2 FASCIOTOMY, PLANTAR, ENDOSCOPIC: 70106 (CPT)  REPAIR, NERVE: 64704 (CPT)  Panel 3 Robotic-assisted primary repair of colon:   Modifier-22 for extensive LOA, BMI>55. Please see the assistant note as below.  SURGEON:  Dr. Heather Penton, MD ASSISTANT: Dr. Garnette Mace Anesthesiologist:  Anesthesiologist: Dario Barter, MD CRNA: Delores Evalene BROCKS, CRNA; Lacretia Camelia NOVAK, CRNA; Niemzyk, Haiyun, CRNA  An experienced assistant was required given the standard of surgical care given the complexity of the case.  This assistant was needed for exposure, dissection, suctioning, retraction, instrument exchange: Mace was critical and provided expert assistance required for safety during the procedure.  If he were not present, this case could have easily turned into an open laparotomy with all the attendant risks and costs.  Dr. Mace performed the cystoscopy.   INDICATIONS: 38 y.o. F  here for definitive surgical management secondary to the indications listed under preoperative diagnoses; please see preoperative note for further details.  Risks of surgery were discussed with the patient including but not limited to: bleeding which may require transfusion or reoperation; infection which may require antibiotics; injury to bowel, bladder, ureters or other surrounding organs; need for additional procedures; thromboembolic phenomenon, incisional problems and other postoperative/anesthesia complications. Written informed consent was obtained.    FINDINGS:     External genitalia, vaginal canal and cervix negative for lesions. Intraoperative findings  Normal upper abdomen including bowel, liver, diaphragmatic surfaces, stomach, and omentum.  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  The uterus was enlarged, and densely adherent to the bladder.  The adhesions were so dense that they were unable to be removed, and myometrium was left on the dome of the bladder.  The bladder was malpositioned, and adherent to the left pelvic sidewall up to the level of the pelvic inlet.  It appeared to negotiate the left round ligament.  The left ureter was never visualized during the case and could not be isolated.  The right and left ovaries appeared normal.  There was a large right ovarian cyst, which was drained to allow visualization of the procedure. Bilateral tubes appeared ruptured from prior BTL  Appendix unable to be visualized.  At the conclusion of the case, the left and right ovaries were sitting against an area that had been completely denuded of peritoneum.  Given the amount of scar tissue, I made the decision to perform the pexy and transition them to the pelvic inlet at the level of the round ligament bilaterally, to avoid peritoneallizing the ovaries bilaterally.  After evaluation of the pelvis, a small rent over the sigmoid serosa was noted, but no entry into the bowel.  General surgery did repair this.  Please see their note.   ANESTHESIA:    General INTRAVENOUS FLUIDS:1600  ml ESTIMATED BLOOD LOSS:100 ml URINE OUTPUT: 600 ml  SPECIMENS: Uterus, cervix, bilateral fallopian tube remnants COMPLICATIONS: None immediate   RATLH/BS:  PROCEDURE IN DETAIL: After informed consent was obtained, the patient was taken to the operating room where general anesthesia was obtained without difficulty.   Dr. Ashley performed the podiatry portion of the procedure prior to my positioning the patient into the Ortho  so  lithotomy.  He wrapped her right ankle, with the plan to place a boot in the PACU.  The patient was positioned in the dorsal lithotomy position in [place] stirrups and her arms were carefully tucked at her sides and the usual precautions were taken. Deep Trendelenburg (20-25 deg) was established to confirm that she does not shift on the table.  Padded tape was used over her acromium processes to ensure stabilization on the table.  She was prepped and draped in normal sterile fashion.  Time-out was performed and a Foley catheter was placed into the bladder. A medium Rumi uterine manipulator was then placed in the uterus without incident.  Preoperative prophylactic antibiotics were given through her iv.  These were repeated at 4 hours.  After infiltration of local anesthetic at the proposed trocar sites, an 8 mm incision was created several centimeters above the umbilicus, and an AirSeal 5mm was placed under direct visualization, after confirmation of OG tube working well. Pneumoperitoneum was created to a pressure of 15 mm Hg. The camera was placed and the abdomin surveyed, noting intact bowel below the site of entry. A survey of the pelvis and upper abdomen revealed the above findings. One right and one left lateral 8-mm robotic ports were placed under direct visualization.  The patient was placed in deepTrendelenburg and the bowel was displaced up into the upper abdomen. The robot was left side docked. The instruments were placed under direct visualization.   The findings above were noted.  A significant portion of this case was spent carefully during lysis of adhesions.  This was significantly more than 50% of the case.  After backfilling the bladder with methylene blue  stained water, layer by layer piece by piece, the bladder was dissected off of the anterior uterine segment and cervix.  Truly enlarged vessels were noted bilaterally, and it was difficult to find the entry of the uterine artery to the  cervix.  However, with some careful dissection, I was able to uncover the internal cervical os.   The right ureter was identified bilaterally coursing outside of the operative field. Round ligaments were divided on each side with the EndoShears and the retroperitoneal space was opened bilaterally. The posterior leaflet of the broad was taken down to the level of the IP ligament. The anterior leaflet of the broad ligament was carefully taken down to the midline.    At the midline, the uterus was unable to be separated from the bladder.  After control of all the vascular surfaces, this myometrium was left on the bladder.  There was a small portion of endometrium left as well, and this was cauterized carefully to avoid both damage to the bladder and also to remove all glandular tissue.  The Fallopian tubes were divided from the ovaries, and care taken to hemostatically transect the utero-ovarian ligament. The peritoneum was taken down to the level of the internal os, and the uterine arteries skeletonized. With strong cephalad pressure from the Rumi, bipolar cautery was used to seal and transect the uterine arteries, and the pedicles allowed to fall away laterally.  A colpotomy was performed circumferentially along the Rumi ring with monopolar electrocautery, starting posteriorly and going up carefully on the sides.  Eventually, the cervix was incised from the vagina using the laparoscopic scissors. The specimen was removed through the vagina.  A pneumo balloon was placed in the vagina and the vaginal cuff was then closed in a running continuous fashion using the  0 strata fix suture with careful  attention to include the vaginal cuff angles, the uterosacral ligaments and the vaginal mucosa within the closure.    General surgery came at this time and put a small oversew on the sigmoid just below the level of the uterus, where it was adherent.  Hemostasis was secured with intraabdominal pressure and  review of all surgical sites. The intraperitoneal pressure was dropped, and all planes of dissection, vascular pedicles and the vaginal cuff were found to be hemostatic.  The robot was undocked.   Attention was turned to the bladder and cystoscopy showed vigorous bilateral ureteral jets.  No stitches were visualized in the bladder during cystoscopy by Dr. Leonce.  The lateral trocars were removed under visualization.  The CO2 gas was released and several deep breaths given to remove any remaining CO2 from the peritoneal cavity.  The skin incisions were closed with 4-0 Monocryl subcuticular stitch and Dermabond.    Anesthesia was reversed without difficulty.  The patient tolerated the procedure well.  Sponge, lap and needle counts were correct x2.  The patient was taken to recovery room in excellent condition.  Her Caprini score was 4, moderate risk.  Because she will be nonweightbearing for 4 weeks, I will give her prophylactic Lovenox  at home for 4 weeks.  I will see her back in the office in 1 week.

## 2023-09-12 NOTE — H&P (Signed)
 HISTORY AND PHYSICAL INTERVAL NOTE:  09/12/2023  7:25 AM  Catherine Lewis  has presented today for surgery, with the diagnosis of AUB M72.2 - Plantar Fasciitis.  The various methods of treatment have been discussed with the patient.  No guarantees were given.  After consideration of risks, benefits and other options for treatment, the patient has consented to surgery.  I have reviewed the patients' chart and labs.     A history and physical examination was performed in my office.  The patient was reexamined.  There have been no changes to this history and physical examination.  Ashley Soulier A

## 2023-09-12 NOTE — Op Note (Signed)
 Pre-Op Dx: Colon injury Post-Op Dx: Same Anesthesia: GETA EBL: 0 mL for my portion Complications:  none apparent for my portion Specimen: None Procedure: Robotic assisted diagnostic laparoscopy, primary repair of colon serosal injury Surgeon: Tye  Indications for procedure: IntraOp consult request from OB/GYN for patient undergoing hysterectomy.  Concerns noted for possible serosal injury on the sigmoid colon while the patient was still in the operating room.  Please see details below.  Description of Procedure:   General surgery took over case from OB/GYN.  Inspection of the area of concern on the sigmoid colon noted a serosal tear along the anterior aspect near the area of hysterectomy.  No sign of full-thickness perforation or injury.  Due to the proximity of the serosal tear to the hysterectomy site and vaginal cuff, decision made to proceed with primary repair of serosa to ensure no further issues.  3-0 STRATAFIX in a running fashion used to approximate the serosal border and portion of the mesentery to cover the tear completely.  Last inspection after repair completed noted no twisting of the colon or excess tension on the mesentery.  Procedure completed at this time and the console controls returned to the OB/GYN team.

## 2023-09-12 NOTE — Progress Notes (Signed)
 Patient to be sent home with knee high stockings, for the non operative extremity, to be worn for 2 days until the Lovenox  is settled per Dr. Verdon. Patient to start Lovenox  tomorrow

## 2023-09-12 NOTE — Discharge Instructions (Addendum)
  [facility]  POST OPERATIVE INSTRUCTIONS FOR DR. ASHLEY AND DR. BAKER [facility] CLINIC PODIATRY DEPARTMENT   Take your medication as prescribed.  Pain medication should be taken only as needed. Dr. Verdon has provided pain medication and blood thinners.  Please take as directed  Keep the dressing clean, dry and intact.  Keep your foot elevated above the heart level for the first 48 hours.  Walking to the bathroom and brief periods of walking are acceptable, unless we have instructed you to be non-weight bearing.  Always wear your post-op shoe when walking.  Always use your crutches if you are to be non-weight bearing.  Do not take a shower. Baths are permissible as long as the foot is kept out of the water.   Every hour you are awake:  Bend your knee 15 times.   Call [facility] [phone]) if any of the following problems occur: You develop a temperature or fever. The bandage becomes saturated with blood. Medication does not stop your pain. Injury of the foot occurs. Any symptoms of infection including redness, odor, or red streaks running from wound.   Discharge instructions after  robotically-assisted total laparoscopic hysterectomy   For the next three days, take ibuprofen  and acetaminophen  on a schedule, every 8 hours. You can take them together or you can intersperse them, and take one every four hours. I also gave you gabapentin  for nighttime, to help you sleep and also to control pain. Take gabapentin  medicines at night for at least the next 3 nights. You also have a narcotic, oxycodone , to take as needed if the above medicines don't help.  Postop constipation is a major cause of pain. Stay well hydrated, walk as you tolerate, and take over the counter senna as well as stool softeners if you need them.   Signs and Symptoms to Report Call our office at [phone] if you have any of the following.   Fever over  100.4 degrees or higher  Severe stomach pain not relieved with pain medications  Bright red bleeding that's heavier than a period that does not slow with rest  To go the bathroom a lot (frequency), you can't hold your urine (urgency), or it hurts when you empty your bladder (urinate)  Chest pain  Shortness of breath  Pain in the calves of your legs  Severe nausea and vomiting not relieved with anti-nausea medications  Signs of infection around your wounds, such as redness, hot to touch, swelling, green/yellow drainage (like pus), bad smelling discharge  Any concerns  What You Can Expect after Surgery  You may see some pink tinged, bloody fluid and bruising around the wound. This is normal.  You may notice shoulder and neck pain. This is caused by the gas used during surgery to expand your abdomen so your surgeon could get to the uterus easier.  You may have a sore throat because of the tube in your mouth during general anesthesia. This will go away in 2 to 3 days.  You may have some stomach cramps.  You may notice spotting on your panties.  You may have pain around the incision sites.   Activities after Your Discharge Follow these guidelines to help speed your recovery at home:  Do the coughing and deep breathing as you did in the hospital for 2 weeks. Use the small blue breathing device, called the incentive spirometer for 2 weeks.  Don't drive if you are in pain or taking narcotic pain  medicine. You may drive when you can safely slam on the brakes, turn the wheel forcefully, and rotate your torso comfortably. This is typically 1-2 weeks. Practice in a parking lot or side street prior to attempting to drive regularly.   Ask others to help with household chores for 4 weeks.  Do not lift anything heavier that 10 pounds for 4-6 weeks. This includes pets, children, and groceries.  Don't do strenuous activities, exercises, or sports like vacuuming, tennis, squash, etc. until your doctor says it  is safe to do so. ---Maintain pelvic rest for 12 weeks. This means nothing in the vagina or rectum at all (no douching, tampons, intercourse) for 12 weeks.   Walk as you feel able. Rest often since it may take two or three weeks for your energy level to return to normal.   You may climb stairs  Avoid constipation:   -Eat fruits, vegetables, and whole grains. Eat small meals as your appetite will take time to return to normal.   -Drink 6 to 8 glasses of water each day unless your doctor has told you to limit your fluids.   -Use a laxative or stool softener as needed if constipation becomes a problem. You may take Miralax, metamucil, Citrucil, Colace, Senekot, FiberCon, etc. If this does not relieve the constipation, try two tablespoons of Milk Of Magnesia every 8 hours until your bowels move.   You may shower. Gently wash the wounds with a mild soap and water. Pat dry.  Do not get in a hot tub, swimming pool, etc. for 6 weeks.  Do not use lotions, oils, powders on the wounds.  Do not douche, use tampons, or have sex until your doctor says it is okay.  Take your pain medicine when you need it. The medicine may not work as well if the pain is bad.  Take the medicines you were taking before surgery. Other medications you will need are pain medications (Norco or Percocet) and nausea medications (Zofran ).  Here is a helpful article from the website http://mitchell.org/, regarding constipation  Here are reasons why constipation occurs after surgery: 1) During the operation and in the recovery room, most people are given opioid pain medication, primarily through an IV, to treat moderate or severe pain. Intravenous opioids include morphine , Dilaudid  and fentanyl . After surgery, patients are often prescribed opioid pain medication to take by mouth at home, including codeine, Vicodin, Norco, and Percocet. All of these medications cause constipation by slowing down the movement of your intestine. 2) Changes in your  diet before surgery can be another culprit. It is common to get specific instructions to change how you normally eat or drink before your surgery, like only having liquids the day before or not having anything to eat or drink after midnight the night before surgery. For this reason, temporary dehydration may occur. This, along with not eating or only having liquids, means that you are getting less fiber than usual. Both these factors contribute to constipation. 3) Changes in your diet after surgery can also contribute to the problem. Although many people don't have dietary restrictions after operations, being under anesthesia can make you lose your appetite for several hours and maybe even days. Some people can even have nausea or vomiting. Not eating or drinking normally means that you are not getting enough fiber and you can get dehydrated, both leading to constipation. 4) Lying in a bed more than usual--which happens before, during and after surgery--combined with the medications and diet changes,  all work together to slow down your colon and make your poop turn to rock.  No one likes to be constipated.  Let's face it, it's not a pleasant feeling when you don't poop for days, then strain on the toilet to finally pass something large enough to cause damage. An ounce of prevention is worth a pound of cure, so: Assume you will be constipated. Plan and prepare accordingly. Post-surgery is one of those unique situations where the temporary use of laxatives can make a world of difference. Always consult with your doctor, and recognize that if you wait several days after surgery to take a laxative, the constipation might be too severe for these over-the-counter options. It is always important to discuss all medications you plan on taking with your doctor. Ask your doctor if you can start the laxative immediately after surgery. *  Here are go-to post-surgery laxatives: Senna: Senna is an herb that acts as a  "stimulant laxative," meaning it increases the activity of the intestine to cause you to have a bowel movement. It comes in many forms, but senna pills are easy to take and are sold over the counter at almost all pharmacies. Since opioid pain medications slow down the activity of the intestine, it makes sense to take a medication to help reverse that side effect. Long-term use of a stimulant laxative is not a good idea since it can make your colon "lazy" and not function properly; however, temporary use immediately after surgery is acceptable. In general, if you are able to eat a normal diet, taking senna soon after surgery works the best. Senna usually works within hours to produce a bowel movement, but this is less predictable when you are taking different medications after surgery. Try not to wait several days to start taking senna, as often it is too late by then. Just like with all medications or supplements, check with your doctor before starting new treatment.   Magnesium: Magnesium is an important mineral that our body needs. We get magnesium from some foods that we eat, especially foods that are high in fiber such as broccoli, almonds and whole grains. There are also magnesium-based medications used to treat constipation including milk of magnesia (magnesium hydroxide), magnesium citrate and magnesium oxide. They work by drawing water into the intestine, putting it into the class of "osmotic" laxatives. Magnesium products in low doses appear to be safe, but if taken in very large doses, can lead to problems such as irregular heartbeat, low blood pressure and even death. It can also affect other medications you might be taking, therefore it is important to discuss using magnesium with your physician and pharmacist before initiating therapy. Most over-the-counter magnesium laxatives work very well to help with the constipation related to surgery, but sometimes they work too well and lead to diarrhea. Make  sure you are somewhere with easy access to a bathroom, just in case.   Bisacodyl : Bisacodyl  (generic name) is sold under brand names such as Dulcolax. Much like senna, it is a "stimulant laxative," meaning it makes your intestines move more quickly to push out the stool. This is another good choice to start taking as soon as your doctor says you can take a laxative after surgery. It comes in pill form and as a suppository, which is a good choice for people who cannot or are not allowed to swallow pills. Studies have shown that it works as a laxative, but like most of these medications, you should use this on  a short-term basis only.   Enema: Enemas strike fear in many people, but FEAR NOT! It's nowhere near as big a deal as you may think. An enema is just a way to get some liquid into your rectum by placing a specially designed device through your anus. If you have never done one, it might seem like a painful, unpleasant, uncomfortable, complicated and lengthy procedure. But in reality, it's simple, takes just a few seconds and is highly effective. The small ready-made bottles you buy at the pharmacy are much easier than the hose/large rubber container type. Those recommended positions illustrated in some instructions are generally not necessary to place the enema. It's very similar to the insertion of a tampon, requiring a slight squat. Some extra lubrication on the enema's tip (or on your anus) will make it a breeze. In certain cases, there is no substitute for a good enema. For example, if someone has not pooped for a few days, the beginning of the poop waiting to come out can become rock hard. Passing that hard stool can lead to much pain and problems like anal fissures. Inserting a little liquid to break up the rock-hard stool will help make its passage much easier. Enemas come with different liquids. Most come with saline, but there are also mineral oil options. You can also use warm water in the reusable  enema containers. They all work. But since saline can sometimes be irritating, so try a mineral oil or water enema instead.  Here are commonly recommended constipation medications that do not work well for post-surgery constipation: Docusate: Docusate (generic name) most commonly referred to as Colace (brand name) is not really a laxative, but is classified as a stool softener. Although this medication is commonly prescribed, it is not recommended for several reasons: 1) there is no good medical evidence that it works 2) even if it has an effect, which is very questionable, it is minimal and cannot combat the intestinal slowing caused by the opioid medications. Skip docusate to save money and space in your pillbox for something more effective.  PEG: Miralax (brand name) is basically a chemical called polyethylene glycol (PEG) and it has gained tremendous popularity as a laxative. This product is an "osmotic laxative" meaning it works by pulling water into the stool, making it softer. This is very similar to the action of natural fiber in foods and supplements. Therefore, the effect seen by this medication is not immediate, causing a bowel movement in a day or more. Is this medication strong enough to battle the constipation related to having an operation? Maybe for some people not prone to constipation. But for most people, other laxatives are better to prevent constipation after surgery.

## 2023-09-12 NOTE — Interval H&P Note (Signed)
 History and Physical Interval Note:  09/12/2023 7:27 AM  Silvano SHAUNNA Bare  has presented today for surgery, with the diagnosis of AUB M72.2 - Plantar Fasciitis.  The various methods of treatment have been discussed with the patient and family. After consideration of risks, benefits and other options for treatment, the patient has consented to  Procedure(s) with comments: HYSTERECTOMY, TOTAL, LAPAROSCOPIC, ROBOT-ASSISTED WITH SALPINGECTOMY (Bilateral) CYSTOSCOPY (N/A) REMOVAL, INTRAUTERINE DEVICE (N/A) FASCIOTOMY, PLANTAR, ENDOSCOPIC (Right) REPAIR, NERVE (Right) - Baxter's Release as a surgical intervention.  [name] history has been reviewed, patient examined, no change in status, stable for surgery.  I have reviewed the patient's chart and labs.  Questions were answered to the patient's satisfaction.     Heather Penton

## 2023-09-12 NOTE — Anesthesia Procedure Notes (Addendum)
 Procedure Name: Intubation Date/Time: 09/12/2023 7:48 AM  Performed by: Zebadiah Willert, CRNAPre-anesthesia Checklist: Patient identified, Emergency Drugs available, Suction available and Patient being monitored Patient Re-evaluated:Patient Re-evaluated prior to induction Oxygen Delivery Method: Circle System Utilized Preoxygenation: Pre-oxygenation with 100% oxygen Induction Type: IV induction Ventilation: Mask ventilation without difficulty Laryngoscope Size: Mac and 3 Grade View: Grade I Tube type: Oral Tube size: 6.5 mm Number of attempts: 1 Airway Equipment and Method: Stylet and Oral airway Placement Confirmation: ETT inserted through vocal cords under direct vision, positive ETCO2 and breath sounds checked- equal and bilateral Secured at: 22 cm Tube secured with: Tape Dental Injury: Teeth and Oropharynx as per pre-operative assessment  Comments: Easy, atraumatic intubation. Patient ramped with three blankets. Head and neck midline. Lips, teeth, and tongue unchanged. ETT carefully placed through vocal cords.

## 2023-09-12 NOTE — Op Note (Addendum)
 Operative note   Surgeon:Clide Remmers [employer] logistics/support/administrative officer: Dr. Greig Blush    Preop diagnosis: 1.  Plantar fasciitis right heel 2.  Distal tarsal tunnel entrapment right heel    Postop diagnosis: Same    Procedure: 1.  Endoscopic plantar fasciotomy right heel 2.  Distal tarsal tunnel release with Baxters nerve release plantar medial right heel    EBL: Minimal    Anesthesia:local and general.  Local consists of a total of 20 cc of 0.25% bupivacaine  and Exparel  long-acting anesthetic and a one-to-one mixture    Hemostasis: Mid calf tourniquet inflated to 200 mmHg for 28 minutes    Specimen: None    Complications: None    Operative indications:Catherine Lewis is an 38 y.o. that presents today for surgical intervention.  The risks/benefits/alternatives/complications have been discussed and consent has been given.    Procedure:  Patient was brought into the OR and placed on the operating table in the supine position. After anesthesia was obtained theright lower extremity was prepped and draped in usual sterile fashion.  Attention was initially directed to the plantar medial right heel where a small stab incision was performed.  Sharp and blunt dissection carried down to the plantar fascia.  This was debrided with the fascial elevator.  The blunt trocar and cannula was introduced.  Next the plantar fascial ligament was observed.  The medial one half of the ligament was then incised with a small diamond blade.  The deep muscle belly was noted at this time.  The wound was flushed with copious amounts of irrigation.  The blunt trocar and cannula were then removed.  The medial incision was then lengthened along the medial aspect of the heel.  Sharp and blunt dissection carried down to the superficial fascia which was incised.  The underlying muscle bellies were noted.  These were reflected both proximal and distal and the deep fascial layers were incised.  The adipose tissue surrounding the deep Baxters  nerve region was then noted.  Further blunt dissection revealed small areas of fibrotic tissue and incised.  The wound was flushed with copious amounts of irrigation.  The superficial subcutaneous tissue was then closed with a 3-0 Vicryl and the skin closed with a 3-0 nylon.  A bulky sterile dressing was applied.  At this time further gynecological surgery was being performed by Dr. Verdon.  In the PACU we will apply an equalizer walker boot with the foot in neutral position.    Patient tolerated the procedure and anesthesia well.  Was transported from the OR to the PACU with all vital signs stable and vascular status intact. To be discharged per routine protocol.  Will follow up in approximately 1 week in the outpatient clinic.

## 2023-09-12 NOTE — Anesthesia Preprocedure Evaluation (Signed)
 Anesthesia Evaluation  Patient identified by MRN, date of birth, ID band Patient awake    Reviewed: Allergy & Precautions, NPO status , Patient's Chart, lab work & pertinent test results  History of Anesthesia Complications Negative for: history of anesthetic complications  Airway Mallampati: III  TM Distance: <3 FB Neck ROM: full    Dental  (+) Chipped, Dental Advidsory Given   Pulmonary neg shortness of breath, neg sleep apnea, neg recent URI, former smoker   Pulmonary exam normal        Cardiovascular Exercise Tolerance: Good (-) angina negative cardio ROS Normal cardiovascular exam     Neuro/Psych  PSYCHIATRIC DISORDERS      negative neurological ROS     GI/Hepatic Neg liver ROS, hiatal hernia,neg GERD  ,,  Endo/Other  diabetes  Class 3 obesity  Renal/GU      Musculoskeletal   Abdominal   Peds  Hematology negative hematology ROS (+)   Anesthesia Other Findings Past Medical History: No date: Anemia No date: Anxiety No date: Gestational diabetes No date: History of hiatal hernia No date: Ovarian cyst No date: Severe obesity [facility])  Past Surgical History: 2007: CESAREAN SECTION 08/22/2020: CESAREAN SECTION WITH BILATERAL TUBAL LIGATION; Bilateral     Comment:  Procedure: REPEAT CESAREAN SECTION WITH BILATERAL TUBAL               LIGATION;  Surgeon: Verdon Keen, MD;  Location: [facility]              ORS;  Service: Obstetrics;  Laterality: Bilateral; 10/08/2019: FASCIECTOMY; Left     Comment:  Procedure: PARTIAL PLANTAR FASCIECTOMY WITH HEEL               RESECTION OF LEFT FOOT;  Surgeon: Neill Boas, DPM;                Location: [facility] ORS;  Service: Podiatry;  Laterality:               Left; 08/06/2016: LAPAROSCOPIC GASTRIC BANDING WITH HIATAL HERNIA REPAIR; N/A     Comment:  Procedure: LAPAROSCOPIC GASTRIC BANDING WITH HIATAL               HERNIA REPAIR;  Surgeon: Tanda Locus, MD;  Location: [facility]                ORS;  Service: General;  Laterality: N/A;  BMI    Body Mass Index: 54.07 kg/m      Reproductive/Obstetrics negative OB ROS                              Anesthesia Physical Anesthesia Plan  ASA: 3  Anesthesia Plan: General ETT   Post-op Pain Management:    Induction: Intravenous  PONV Risk Score and Plan: Ondansetron , Dexamethasone , Midazolam  and Treatment may vary due to age or medical condition  Airway Management Planned: Oral ETT and Video Laryngoscope Planned  Additional Equipment:   Intra-op Plan:   Post-operative Plan: Extubation in OR  Informed Consent: I have reviewed the patients History and Physical, chart, labs and discussed the procedure including the risks, benefits and alternatives for the proposed anesthesia with the patient or authorized representative who has indicated his/her understanding and acceptance.     Dental Advisory Given  Plan Discussed with: Anesthesiologist, CRNA and Surgeon  Anesthesia Plan Comments: (Patient consented for risks of anesthesia including but not limited to:  - adverse reactions to medications - damage to  eyes, teeth, lips or other oral mucosa - nerve damage due to positioning  - sore throat or hoarseness - Damage to heart, brain, nerves, lungs, other parts of body or loss of life  Patient voiced understanding.)        Anesthesia Quick Evaluation

## 2023-09-12 NOTE — Transfer of Care (Signed)
 Immediate Anesthesia Transfer of Care Note  Patient: Catherine Lewis  Procedure(s) Performed: HYSTERECTOMY, TOTAL, LAPAROSCOPIC, ROBOT-ASSISTED WITH SALPINGECTOMY and Oophoropexy (Bilateral: Uterus) CYSTOSCOPY (Bladder) REMOVAL, INTRAUTERINE DEVICE (Uterus) LYSIS, ADHESIONS, ROBOT-ASSISTED, LAPAROSCOPIC FASCIOTOMY, PLANTAR, ENDOSCOPIC (Right) REPAIR, NERVE (Right) Robotic-assisted primary repair of colon (Pelvis)  Patient Location: PACU  Anesthesia Type:General  Level of Consciousness: drowsy  Airway & Oxygen Therapy: Patient Spontanous Breathing and Patient connected to face mask oxygen  Post-op Assessment: Report given to RN and Post -op Vital signs reviewed and stable  Post vital signs: Reviewed and stable  Last Vitals:  Vitals Value Taken Time  BP 142/103 09/12/23 13:19  Temp    Pulse 78 09/12/23 13:23  Resp 17 09/12/23 13:23  SpO2 100 % 09/12/23 13:23  Vitals shown include unfiled device data.  Last Pain:  Vitals:   09/12/23 0639  TempSrc: Temporal  PainSc: 1          Complications: No notable events documented.

## 2023-09-15 ENCOUNTER — Encounter: Payer: Self-pay | Admitting: Obstetrics and Gynecology

## 2023-09-15 NOTE — Anesthesia Postprocedure Evaluation (Signed)
 Anesthesia Post Note  Patient: MERRISSA GIACOBBE  Procedure(s) Performed: HYSTERECTOMY, TOTAL, LAPAROSCOPIC, ROBOT-ASSISTED WITH SALPINGECTOMY and Oophoropexy (Bilateral: Uterus) CYSTOSCOPY (Bladder) REMOVAL, INTRAUTERINE DEVICE (Uterus) LYSIS, ADHESIONS, ROBOT-ASSISTED, LAPAROSCOPIC (Abdomen) FASCIOTOMY, PLANTAR, ENDOSCOPIC (Right) REPAIR, NERVE (Right) Robotic-assisted primary repair of colon (Pelvis)  Patient location during evaluation: PACU Anesthesia Type: General Level of consciousness: awake and alert Pain management: pain level controlled Vital Signs Assessment: post-procedure vital signs reviewed and stable Respiratory status: spontaneous breathing, nonlabored ventilation, respiratory function stable and patient connected to nasal cannula oxygen Cardiovascular status: blood pressure returned to baseline and stable Postop Assessment: no apparent nausea or vomiting Anesthetic complications: no   No notable events documented.   Last Vitals:  Vitals:   09/12/23 1430 09/12/23 1507  BP: 134/81 (!) 110/57  Pulse: 76 82  Resp: 13 14  Temp:  36.6 C  SpO2: 93% 96%    Last Pain:  Vitals:   09/13/23 1234  TempSrc:   PainSc: 3                  Prentice Murphy

## 2023-09-16 LAB — SURGICAL PATHOLOGY

## 2024-02-27 ENCOUNTER — Encounter (HOSPITAL_COMMUNITY): Payer: Self-pay | Admitting: *Deleted
